# Patient Record
Sex: Female | Born: 1975
Health system: Southern US, Community
[De-identification: ages and names within clinical notes are randomized; demographics above are authoritative.]

## PROBLEM LIST (undated history)

## (undated) DIAGNOSIS — D649 Anemia, unspecified: Secondary | ICD-10-CM

## (undated) DIAGNOSIS — IMO0002 Reserved for concepts with insufficient information to code with codable children: Secondary | ICD-10-CM

## (undated) DIAGNOSIS — E039 Hypothyroidism, unspecified: Secondary | ICD-10-CM

## (undated) DIAGNOSIS — K648 Other hemorrhoids: Secondary | ICD-10-CM

## (undated) HISTORY — DX: Other hemorrhoids: K64.8

## (undated) HISTORY — DX: Hypothyroidism, unspecified: E03.9

## (undated) HISTORY — DX: Anemia, unspecified: D64.9

## (undated) HISTORY — PX: PELVIC LAPAROSCOPY: SHX162

## (undated) HISTORY — DX: Reserved for concepts with insufficient information to code with codable children: IMO0002

---

## 2003-02-08 ENCOUNTER — Encounter: Payer: Self-pay | Admitting: Family Medicine

## 2003-02-08 ENCOUNTER — Encounter: Admission: RE | Admit: 2003-02-08 | Discharge: 2003-02-08 | Payer: Self-pay | Admitting: Family Medicine

## 2003-02-25 ENCOUNTER — Encounter: Admission: RE | Admit: 2003-02-25 | Discharge: 2003-02-25 | Payer: Self-pay | Admitting: Family Medicine

## 2003-02-25 ENCOUNTER — Encounter: Payer: Self-pay | Admitting: Family Medicine

## 2004-01-14 ENCOUNTER — Encounter: Admission: RE | Admit: 2004-01-14 | Discharge: 2004-01-14 | Payer: Self-pay | Admitting: Gastroenterology

## 2004-02-10 ENCOUNTER — Other Ambulatory Visit: Admission: RE | Admit: 2004-02-10 | Discharge: 2004-02-10 | Payer: Self-pay | Admitting: Obstetrics and Gynecology

## 2004-07-28 ENCOUNTER — Ambulatory Visit (HOSPITAL_COMMUNITY): Admission: RE | Admit: 2004-07-28 | Discharge: 2004-07-28 | Payer: Self-pay | Admitting: Obstetrics and Gynecology

## 2004-07-28 ENCOUNTER — Encounter (INDEPENDENT_AMBULATORY_CARE_PROVIDER_SITE_OTHER): Payer: Self-pay | Admitting: *Deleted

## 2005-03-15 ENCOUNTER — Encounter: Admission: RE | Admit: 2005-03-15 | Discharge: 2005-03-15 | Payer: Self-pay | Admitting: Family Medicine

## 2008-05-24 ENCOUNTER — Inpatient Hospital Stay (HOSPITAL_COMMUNITY): Admission: AD | Admit: 2008-05-24 | Discharge: 2008-05-24 | Payer: Self-pay | Admitting: Obstetrics & Gynecology

## 2008-05-26 ENCOUNTER — Inpatient Hospital Stay (HOSPITAL_COMMUNITY): Admission: AD | Admit: 2008-05-26 | Discharge: 2008-05-29 | Payer: Self-pay | Admitting: Obstetrics & Gynecology

## 2008-05-26 ENCOUNTER — Inpatient Hospital Stay (HOSPITAL_COMMUNITY): Admission: AD | Admit: 2008-05-26 | Discharge: 2008-05-26 | Payer: Self-pay | Admitting: Obstetrics and Gynecology

## 2008-06-01 ENCOUNTER — Encounter: Admission: RE | Admit: 2008-06-01 | Discharge: 2008-06-30 | Payer: Self-pay | Admitting: Obstetrics & Gynecology

## 2008-07-01 ENCOUNTER — Encounter: Admission: RE | Admit: 2008-07-01 | Discharge: 2008-07-31 | Payer: Self-pay | Admitting: Obstetrics & Gynecology

## 2008-08-01 ENCOUNTER — Encounter: Admission: RE | Admit: 2008-08-01 | Discharge: 2008-08-30 | Payer: Self-pay | Admitting: Obstetrics & Gynecology

## 2008-08-31 ENCOUNTER — Encounter: Admission: RE | Admit: 2008-08-31 | Discharge: 2008-09-30 | Payer: Self-pay | Admitting: Obstetrics & Gynecology

## 2008-10-01 ENCOUNTER — Encounter: Admission: RE | Admit: 2008-10-01 | Discharge: 2008-10-31 | Payer: Self-pay | Admitting: Obstetrics & Gynecology

## 2009-08-21 ENCOUNTER — Emergency Department: Payer: Self-pay | Admitting: Emergency Medicine

## 2009-08-24 ENCOUNTER — Encounter
Admission: RE | Admit: 2009-08-24 | Discharge: 2009-08-24 | Payer: Self-pay | Admitting: Physical Medicine and Rehabilitation

## 2009-09-07 ENCOUNTER — Encounter: Payer: Self-pay | Admitting: Family Medicine

## 2010-09-01 ENCOUNTER — Encounter: Payer: Self-pay | Admitting: Family Medicine

## 2010-09-21 ENCOUNTER — Encounter: Payer: Self-pay | Admitting: Family Medicine

## 2010-09-21 ENCOUNTER — Ambulatory Visit
Admission: RE | Admit: 2010-09-21 | Discharge: 2010-09-21 | Payer: Self-pay | Source: Home / Self Care | Attending: Family Medicine | Admitting: Family Medicine

## 2010-09-21 ENCOUNTER — Other Ambulatory Visit: Payer: Self-pay | Admitting: Family Medicine

## 2010-09-21 DIAGNOSIS — N801 Endometriosis of ovary: Secondary | ICD-10-CM | POA: Insufficient documentation

## 2010-09-21 DIAGNOSIS — D649 Anemia, unspecified: Secondary | ICD-10-CM | POA: Insufficient documentation

## 2010-09-21 DIAGNOSIS — R109 Unspecified abdominal pain: Secondary | ICD-10-CM | POA: Insufficient documentation

## 2010-09-21 LAB — BASIC METABOLIC PANEL
BUN: 10 mg/dL (ref 6–23)
CO2: 26 mEq/L (ref 19–32)
Calcium: 9.2 mg/dL (ref 8.4–10.5)
Chloride: 101 mEq/L (ref 96–112)
Creatinine, Ser: 0.7 mg/dL (ref 0.4–1.2)
GFR: 108.86 mL/min (ref >60.00)
Glucose, Bld: 86 mg/dL (ref 70–99)
Potassium: 4.7 mEq/L (ref 3.5–5.1)
Sodium: 135 mEq/L (ref 135–145)

## 2010-09-21 LAB — CBC WITH DIFFERENTIAL/PLATELET
Basophils Absolute: 0 10*3/uL (ref 0.0–0.1)
Basophils Relative: 0.4 % (ref 0.0–3.0)
Eosinophils Absolute: 0.1 10*3/uL (ref 0.0–0.7)
Eosinophils Relative: 1.3 % (ref 0.0–5.0)
HCT: 38.1 % (ref 36.0–46.0)
Hemoglobin: 12.8 g/dL (ref 12.0–15.0)
Lymphocytes Relative: 32.7 % (ref 12.0–46.0)
Lymphs Abs: 2.1 10*3/uL (ref 0.7–4.0)
MCHC: 33.5 g/dL (ref 30.0–36.0)
MCV: 84.2 fl (ref 78.0–100.0)
Monocytes Absolute: 0.5 10*3/uL (ref 0.1–1.0)
Monocytes Relative: 7 % (ref 3.0–12.0)
Neutro Abs: 3.9 10*3/uL (ref 1.4–7.7)
Neutrophils Relative %: 58.6 % (ref 43.0–77.0)
Platelets: 267 10*3/uL (ref 150.0–400.0)
RBC: 4.53 Mil/uL (ref 3.87–5.11)
RDW: 13.4 % (ref 11.5–14.6)
WBC: 6.6 10*3/uL (ref 4.5–10.5)

## 2010-09-21 LAB — LIPID PANEL
Cholesterol: 137 mg/dL (ref 0–200)
HDL: 41.4 mg/dL (ref >39.00)
LDL Cholesterol: 89 mg/dL (ref 0–99)
Total CHOL/HDL Ratio: 3
Triglycerides: 31 mg/dL (ref 0.0–149.0)
VLDL: 6.2 mg/dL (ref 0.0–40.0)

## 2010-09-21 LAB — HEPATIC FUNCTION PANEL
ALT: 23 U/L (ref 0–35)
AST: 21 U/L (ref 0–37)
Albumin: 3.9 g/dL (ref 3.5–5.2)
Alkaline Phosphatase: 57 U/L (ref 39–117)
Bilirubin, Direct: 0.2 mg/dL (ref 0.0–0.3)
Total Bilirubin: 0.9 mg/dL (ref 0.3–1.2)
Total Protein: 7.2 g/dL (ref 6.0–8.3)

## 2010-09-21 LAB — TSH: TSH: 3.71 u[IU]/mL (ref 0.35–5.50)

## 2010-10-17 ENCOUNTER — Encounter: Payer: Self-pay | Admitting: Family Medicine

## 2010-10-19 NOTE — Assessment & Plan Note (Signed)
Summary: NEW PATIENT, EST / LFW   Vital Signs:  Patient profile:   35 year old female Height:      65.50 inches Weight:      155 pounds BMI:     25.49 Temp:     98.3 degrees F oral Pulse rate:   75 / minute Pulse rhythm:   regular BP sitting:   102 / 70  (left arm) Cuff size:   regular  Vitals Entered By: Linde Gillis CMA Duncan Dull) (September 21, 2010 11:05 AM) CC: new patient, establish care   History of Present Illness: 35 yo here to establish care.  Abdominal pain- very intermittent, usually postpradnial, usually lower quadrant. Sometimes right sided, other times left. No associated nausea, vomiting or diarrhea. Occasional constipation. Feels like her hair is falling out as well.  Was told after her pregnancy that she was anemic. No SOB or CP. No dizziness.  herniated disc- followed by [facility].  Preventive Screening-Counseling & Management  Alcohol-Tobacco     Smoking Status: never      Drug Use:  no.    Current Medications (verified): 1)  Centrum Ultra Womens  Tabs (Multiple Vitamins-Minerals) .... 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 Eyes:  Denies blurring. ENT:  Denies difficulty  swallowing. CV:  Denies chest pain or discomfort. Resp:  Denies shortness of breath. GI:  Denies abdominal pain, bloody stools, and change in bowel habits. GU:  Denies discharge and dysuria. MS:  Complains of low back pain; denies joint pain, joint redness, joint swelling, and muscle weakness. Derm:  Denies rash. Neuro:  Denies headaches. Psych:  Denies anxiety and depression. Endo:  Denies cold intolerance and heat intolerance. Heme:  Denies abnormal bruising and bleeding.  Physical Exam  General:  Well-developed,well-nourished,in no acute distress; alert,appropriate and cooperative throughout examination Head:  normocephalic, atraumatic, and no abnormalities observed.   Eyes:  vision grossly intact, pupils equal, pupils round, and pupils reactive to light.   Ears:  R ear normal and L ear normal.   Nose:  no external deformity.   Mouth:  good dentition.   Neck:  No deformities, masses, or tenderness noted. Lungs:  Normal respiratory effort, chest expands symmetrically. Lungs are clear to auscultation, no crackles or wheezes. Heart:  Normal rate and regular rhythm. S1 and S2 normal without gallop, murmur, click, rub or other extra sounds. Abdomen:  Bowel sounds positive,abdomen soft and non-tender without masses, organomegaly or hernias noted. Msk:  No deformity or scoliosis noted of thoracic or lumbar spine.   Extremities:  no edema Neurologic:  alert & oriented X3 and gait normal.   Skin:  Intact without suspicious lesions or rashes Psych:  Cognition and judgment appear intact. Alert and cooperative with normal attention span and concentration. No apparent delusions, illusions, hallucinations   Impression &  Recommendations:  Problem # 1:  ABDOMINAL PAIN OTHER SPECIFIED SITE (ICD-789.09) Assessment New Seems most consistent with gas pain. Abdominal exam is benign.  Will check hepatic panel. Advised pt to keep symptom journal, if persists, will get abdomina  U/S. Orders: TLB-Hepatic/Liver Function Pnl (80076-HEPATIC)  Problem # 2:  Hx of ANEMIA (ICD-285.9) Assessment: Unchanged recheck CBC today. Orders: TLB-CBC Platelet - w/Differential (85025-CBCD)  Problem # 3:  R/O UNSPECIFIED HYPOTHYROIDISM (ICD-244.9) Assessment: New check TSH given hair loss and family history. Orders: TLB-TSH (Thyroid Stimulating Hormone) (84443-TSH)  Complete Medication List: 1)  Centrum Ultra Womens Tabs (Multiple vitamins-minerals) .... Take one tablet by mouth daily  Other Orders: Venipuncture (24401) TLB-BMP (Basic Metabolic Panel-BMET) (80048-METABOL) TLB-Lipid Panel (80061-LIPID)  Patient Instructions: 1)  Great to meet you. 2)  If your abdominal pain continues, lets get an ultrasound of your abdomen.     Orders Added: 1)  Venipuncture [36415] 2)  TLB-Hepatic/Liver Function Pnl [80076-HEPATIC] 3)  TLB-TSH (Thyroid Stimulating Hormone) [84443-TSH] 4)  TLB-BMP (Basic Metabolic Panel-BMET) [80048-METABOL] 5)  TLB-Lipid Panel [80061-LIPID] 6)  TLB-CBC Platelet - w/Differential [85025-CBCD] 7)  New Patient Level III [02725]    Current Allergies (reviewed today): No known allergies   Prevention & Chronic Care Immunizations   Influenza vaccine: Not documented    Tetanus booster: 10/08/2001: historical   Tetanus booster due: 10/09/2011    Pneumococcal vaccine: Not documented  Other Screening   Pap smear: normal, historical  (08/31/2010)   Pap smear due: 09/01/2011   Smoking status: never  (09/21/2010)   TD Result Date:  10/08/2001 TD Result:  historical PAP Result Date:  08/31/2010 PAP Result:  normal, historical

## 2010-10-19 NOTE — Letter (Signed)
[facility] - 2010  [facility] at [facility] 2009 - 2010   Imported By: Maryln Gottron 10/10/2010 09:55:34  _____________________________________________________________________  External Attachment:    Type:   Image     Comment:   External Document

## 2010-10-25 NOTE — Miscellaneous (Signed)
Summary: ROI  ROI   Imported By: Kassie Mends 10/20/2010 08:42:13  _____________________________________________________________________  External Attachment:    Type:   Image     Comment:   External Document

## 2010-10-25 NOTE — Miscellaneous (Signed)
Summary: prevention update  Clinical Lists Changes  Observations: Added new observation of PAP DUE: 09/02/2011 (10/17/2010 11:27) Added new observation of HDLNXTDUE: 09/22/2015 (10/17/2010 11:27) Added new observation of LDLNXTDUE: 09/22/2015 (10/17/2010 11:27) Added new observation of CREATNXTDUE: 09/22/2011 (10/17/2010 11:27) Added new observation of POTASSIUMDUE: 09/22/2011 (10/17/2010 11:27) Added new observation of LAST PAP DAT: 09/01/2010 (09/01/2010 11:28) Added new observation of PAP SMEAR: normal (09/01/2010 11:28)     Last PAP:  normal, historical (08/31/2010 11:25:20 AM) PAP Result Date:  09/01/2010 PAP Result:  normal

## 2010-11-02 NOTE — Letter (Signed)
Summary: Historic Patient File  Historic Patient File   Imported By: Kassie Mends 10/25/2010 09:52:31  _____________________________________________________________________  External Attachment:    Type:   Image     Comment:   External Document

## 2011-01-30 NOTE — Consult Note (Signed)
[name], [name]                 ACCOUNT NO.:  [id number]   MEDICAL RECORD NO.:  [id number]          PATIENT TYPE:  INP   LOCATION:  9170                          FACILITY:  [facility]   PHYSICIAN:  Lenoard Aden, M.D.DATE OF BIRTH:  [birth year]   DATE OF CONSULTATION:  05/26/2008  DATE OF DISCHARGE:                                 CONSULTATION   CHIEF COMPLAINTS:  Contractions.   She is a 35 year old female G1, P0 who presents at 39 pulse weeks with  increased frequency of contractions today.   ALLERGIES:  She has no known drug allergies.   MEDICATIONS:  Prenatal vitamins.   SOCIAL HISTORY:  She is a nonsmoker, nondrinker.  She denies domestic or  physical violence.   FAMILY HISTORY:  Diabetes, hypertension, myocardial infarction, thyroid  disease and depression.   Prenatal course to date has been uncomplicated.   PHYSICAL EXAMINATION:  GENERAL:  She is in a well-developed, well-  nourished [place] female, in no acute distress.  HEENT:  Normal.  NECK:  Supple.  Full range of motion.  LUNGS:  Clear.  HEART:  Regular rhythm.  ABDOMEN:  Soft, gravid, nontender.  Estimated fetal weight 7-1/2 pounds.  Cervix is closed, 50% vertex, -1 to 0 station.  EXTREMITIES:  There are  no cords.  NEUR LOGIC:  Nonfocal.  SKIN:  Intact.   NST is reactive with fetal heart rate ranging 140 is with good beat-to-  beat variability and accelerations 15 x 50, no decelerations.  Contractions irregularly every 3-7 minutes apart.   IMPRESSION:  Prodromal labor pattern, no evidence of cervical change  upon sequential cervical exams over the last 3 hours.   PLAN:  Plan will be to discharge home, offered Ambien, the patient  declined.  She is going to follow up in the office for her routine  appointment today which includes an ultrasound.      Lenoard Aden, M.D.  Electronically Signed     RJT/MEDQ  D:  05/26/2008  T:  05/27/2008  Job:  [id number]

## 2011-02-02 NOTE — Op Note (Signed)
[name]                 ACCOUNT NO.:  [id number]   MEDICAL RECORD NO.:  [id number]          PATIENT TYPE:  AMB   LOCATION:  SDC                           [facility]ACILITY:  [facility]   PHYSICIAN:  [name], M.D.   DATE OF BIRTH:  05/16/1976   DATE OF PROCEDURE:  07/28/2004  DATE OF DISCHARGE:                                 OPERATIVE REPORT   PREOPERATIVE DIAGNOSES:  Adnexal mass and pelvic pain.   POSTOPERATIVE DIAGNOSES:  Adnexal mass and pelvic pain.  Endometrioma.   SURGEON:  [name], M.D.   ASSISTANT:  [name], M.D.   ANESTHESIA:  General endotracheal.   COMPLICATIONS:  None.   ESTIMATED BLOOD LOSS:  Minimal.   URINE OUTPUT:  100 mL clear urine at the start of the procedure.   FINDINGS:  A 9 cm left adnexal mass consistent with endometrioma.   SPECIMENS:  Ovarian cyst wall to pathology.   INDICATIONS FOR PROCEDURE:  This is a 35 year old, gravida 0, [place] female  who has a history of progressively worsening painful menses and was found to  have a complex appearing adnexal mass suggestive of an endometrioma on CAT  scan in May of 2005.  Followup ultrasound confirmed the presence of a 4 cm  homogeneous appearing cyst consistent with endometrioma. Followup ultrasound  showed that the cyst had enlarged to a diameter of 7-8 cm, again appeared  homogeneous suggestive of an endometrioma.  The patient was counseled on the  risks, benefits, and alternatives to laparoscopic surgery prior to the  procedure and informed consent was obtained.  Discussed the risk of  hemorrhage requiring transfusion, infection which could cause intraabdominal  scarring or cause adhesions or prolong hospitalization, possibility of  having removed the ovary if cystectomy could not be performed successfully,  possibility of converting to open laparotomy if laparoscope approach was not  feasible, possibility of injury to the bowel, the bladder, the ureters, or  other abdominal  organs which could require additional surgery and prolong  hospitalization, DVT, pulmonary embolus, anesthetic related complications  and even death.  The patient voiced an understanding of all these risks  prior to procedure.  Informed consent was obtained before taking her to the  OR.   DESCRIPTION OF PROCEDURE:  The patient was taken to the operating room where  she was given general anesthesia, she was then prepped and draped in the  usual sterile fashion, placed in dorsal lithotomy position. A red rubber  catheter was then used to drain the bladder. Bimanual exam confirmed the  presence of a mobile mass on the left adnexa, uterus was anteverted and  small. The patient is virginal and a very tight hymenal ring was noted. The  cervix was visualized, cervix was grasped with a single tooth tenaculum and  the acorn cannula was then introduced, speculum was then removed and  attention was then turned to the patient's abdomen after sterile gown and  gloves were changed.  4 mL of 0.5% plain Marcaine were injected in the  infraumbilical area and a 10 mm skin incision was made in  the infraumbilical  area with the scalpel and this was carried down to the fascia. The fascia  was then identified, grasped between two Kocher clamps, elevated and entered  sharply. The peritoneum was identified, grasped with a hemostat and entered  sharply. A finger was placed into the incision, there was no evidence of any  adhesions. The fascia was then secured with a pursestring Vicryl suture and  the Hasson trocar and sleeve were introduced.  CO2 gas was allowed to  insufflate and intraabdominal placement was confirmed with the laparoscope.  On inspection, the cyst had a smooth appearance. There were no external  excrescences noted. The gallbladder and liver were visualized and were  normal. The appendix appeared normal. The uterus and the right ovary  appeared normal. The cul-de-sac was visualized and there were  some  superficial endometriotic appearing implants present in the posterior cul-de-  sac.  A second incision was then made in the patient's right lower quadrant  first by injecting with 2 mL of 0.5% Marcaine making a 5 mm incision and the  5 mm trocar and sleeve were introduced under direct visualization. This  procedure was duplicated on the left side. Once the ports were place, the  blunt probe was then introduced to further inspect the pelvis and the cyst.  The cyst was mobile and upon lifting the cyst with the blunt probe it  promptly ruptured and a large amount of chocolate endometriotic appearing  fluid spilled into the pelvis. The Nezhat was then used to irrigate this  fluid. The cyst was carefully inspected, there was no evidence of any  internal papillation or any suggestion of malignancy.  Two atraumatic  graspers were then used to perform a cystectomy. The cyst wall was  successfully removed from the ovary with minimal bleeding.  Once the cyst  wall was free from the ovary, the 5 mm scope was then introduced into one of  the 5 mm ports and an endobag was introduced through the umbilical incision.  The cyst was then placed into the endobag under laparoscopic visualization  and the endobag and cyst were removed through the umbilical port. This was  then sent to pathology labeled as cyst wall.  The Hasson trocar was then  replaced back into the umbilical incision, the laparoscope was converted  back to the 10 mm scope and the area was carefully inspected. The Nezhat was  used for irrigation. There was some bleeding coming from the ovary, this was  cauterized successfully with the Kleppinger's.  Once cautery was produced,  some of the CO2 gas was allowed to escape to visualize the ovary as the  pressure was released and there was no active bleeding noted. The area was  then copiously and irrigation fluid removed. The superficial areas of endometriosis in the posterior cul-de-sac were  then cauterized with the  Kleppinger's.  The ureters were visualized and were functioning normally.  The right ovary and tube were normal. The left fallopian tube appeared  normal, the uterus was normal and there was no evidence of any further  endometriotic implants.  At this point, 10 mL of 0.5% plain Marcaine were  introduced through one of the ports and the ovary was coated in Marcaine to  facilitate postoperative analgesia.  The ports and instruments were then  removed under direct visualization and CO2 gas was allowed to escape. There  was no evidence of any bleeding and the port sites were hemostatic. At this  point the remaining instruments  and the 10 mm port were removed. All gas was  allowed to escape. The 10 mm skin incision was closed with a pursestring  suture.  Once the fascia was secured, the 10 mm skin incision was closed  with a 4-0 Vicryl subcuticular stitch and the 5 mm ports were closed with  Dermabond. At this point, attention was then turned to the patient's vagina,  the acorn cannula and tenaculum removed, there was normal bleeding coming  from the cervix and the patient was then taken out of the dorsal lithotomy  position and was awakened from general anesthesia. There were no  complications.  All sponge, lap, needle and instrument counts were correct  x2.      JW/MEDQ  D:  07/28/2004  T:  07/28/2004  Job:  [id number]

## 2011-02-02 NOTE — H&P (Signed)
[name], [name]                 ACCOUNT NO.:  [id number]   MEDICAL RECORD NO.:  [id number]          PATIENT TYPE:  AMB   LOCATION:  SDC                           [facility]   PHYSICIAN:  Richardean Sale, M.D.   DATE OF BIRTH:  [birth year]   DATE OF ADMISSION:  07/28/2004  DATE OF DISCHARGE:                                HISTORY & PHYSICAL   CHIEF COMPLAINT:  Adnexal mass.   HISTORY OF PRESENT ILLNESS:  This is a 35 year old, gravida 0, [place] female  who presents for surgical management of an adnexal mass. The mass was  initially identified on a CAT scan which was performed secondary to  complaints of abdominal pain, chronic diarrhea and weight loss. The patient  underwent evaluation by gastroenterology and was found to have a  gastrointestinal parasite in addition to the adnexal mass. On ultrasound in  May, the adnexal mass appeared to be coming from the left ovary and measured  approximately 4 cm in maximum diameter and was homogeneous suggestive of an  endometrioma. Followup scan six weeks later confirmed persistence of the  mass with maximum diameter at that time of 4.5 cm. Given the patient's  weight loss, she underwent evaluation with tumor markers which were all  negative. The patient's menstrual history is significant for very painful  menses specifically on the left side. The patient will miss work secondary  to pain and stays in bed for the majority of her menstrual cycle.  Her  history is highly suggestive of endometriosis. The patient was offered  laparoscopic evaluation and management of the adnexal mass in July of this  year but has postponed her surgery secondary to needing to travel to visit  her family up in [place]. While she was in [place], she had acute onset of  worsening abdominal pain, underwent ultrasound evaluation there that  suggested the mass had enlarged to 6-7 cm in diameter, again appeared  consistent with an endometrioma or possibly a hemorrhagic  cyst per the  ultrasound report from [place]. Followup scan here revealed maximum diameter  of 8.3 cm.  The patient states that over the past few months, she has had  worsening pain with her menses. She denies any chest pain, shortness of  breath. Since her parasite has been treated, she has had no further weight  loss, no constitutional symptoms, no abdominal bloating and no change in  bowel or bladder habits.   PAST MEDICAL HISTORY:  Gastrointestinal parasite.   PAST SURGICAL HISTORY:  None.   MEDICATIONS:  Tylenol as needed.   ALLERGIES:  No known drug allergies.   SOCIAL HISTORY:  No tobacco, alcohol or drugs, she is married.   FAMILY HISTORY:  No known breast, ovarian, uterine or colon cancer. No known  history of blood clot,  DVT or bleeding __________.   REVIEW OF SYMPTOMS:  Denies chest pain, shortness of breath, diarrhea,  constipation, change in bladder habits, dyspnea on exertion, change in  abdominal girth, positive for left lower quadrant pain with menses and with  intercourse.   PHYSICAL EXAMINATION:  VITAL SIGNS:  Blood pressure is 96/58, pulse is in  the 80's and regular, weight 119.5.  Height 5 foot 5.  GENERAL:  She is a thin but well developed female who appears in no acute  distress.  HEART:  Regular rate and rhythm.  LUNGS:  Clear to auscultation bilaterally.  NECK/THYROID:  Within normal limits with no masses.  ABDOMEN:  Soft, nontender, nondistended. No palpable masses. Liver and  spleen are normal. No groin adenopathy.  EXTREMITIES:  No cyanosis, clubbing or edema.  PELVIC:  Extremely difficult secondary to the patient's voluntary guarding.  There is fullness in the left adnexa.   ASSESSMENT:  A 35 year old, gravida 0, [place] female with complex left  adnexal mass. Clinical history suggestive of endometriosis.  Appearance of  mass on ultrasound suggestive of endometrioma or hemorrhagic cyst.  Tumor  markers negative.   PLAN:  Will proceed with  laparoscopic evaluation and attempt at laparoscopic  cystectomy and possible oophorectomy. Explained to the patient that given  the size of the cyst, if there is endometriosis identified it may be  difficult to preserve the ovary and procedure may have to be converted to  open laparotomy.  Reviewed with the patient the possibility of malignancy  given that this mass is complex, however, her tumor markers are negative and  she has no other symptoms suggestive of cancer and no family history.  We  discussed the risks, benefits, and alternatives of the procedure in detail,  reviewed the risk of hemorrhage requiring transfusion, injury to bowel,  bladder, ureter or other abdominal organs which could require additional  surgery or prolonged hospitalization.  Infection which could prolong  hospitalization causing intraabdominal scarring, DVT, PE, and anesthetic  complications.  The patient voiced an understanding of all these risks and  agreed to proceed. Informed consent was obtained, all questions answered.      JW/MEDQ  D:  07/27/2004  T:  07/27/2004  Job:  [id number]

## 2011-04-10 ENCOUNTER — Other Ambulatory Visit: Payer: Self-pay | Admitting: Family Medicine

## 2011-04-10 ENCOUNTER — Ambulatory Visit (INDEPENDENT_AMBULATORY_CARE_PROVIDER_SITE_OTHER): Payer: Self-pay | Admitting: Family Medicine

## 2011-04-10 VITALS — BP 100/70 | HR 70 | Temp 97.1°F

## 2011-04-10 DIAGNOSIS — Z23 Encounter for immunization: Secondary | ICD-10-CM

## 2011-04-10 DIAGNOSIS — X58XXXA Exposure to other specified factors, initial encounter: Secondary | ICD-10-CM

## 2011-04-10 DIAGNOSIS — IMO0002 Reserved for concepts with insufficient information to code with codable children: Secondary | ICD-10-CM

## 2011-04-10 DIAGNOSIS — T148XXA Other injury of unspecified body region, initial encounter: Secondary | ICD-10-CM

## 2011-04-10 MED ORDER — DOXYCYCLINE HYCLATE 100 MG PO CAPS: 100.0000 mg | ORAL_CAPSULE | Freq: Two times a day (BID) | ORAL | Status: AC

## 2011-04-10 NOTE — Progress Notes (Signed)
  Subjective:    Patient ID: [name], female    DOB: [birth year], 36 y.o.   MRN: [id number]  HPI    Review of Systems     Objective:   Physical Exam        Assessment & Plan:   Subjective:    [name] is a 35 y.o. female who presents for evaluation of a laceration to the finger. Injury occurred a few minutes ago. The mechanism of the wound was metal edge. The patient reports no coldness, no paresthesias in the affected area. There were no other injuries.  Patient denies numbness and weakness. The tetanus status is more than 5 years ago.  Patient Active Problem List  Diagnoses  . ANEMIA  . ENDOMETRIOSIS, OVARY  . ABDOMINAL PAIN OTHER SPECIFIED SITE   No past medical history on file. No past surgical history on file. History  Substance Use Topics  . Smoking status: Not on file  . Smokeless tobacco: Not on file  . Alcohol Use: Not on file   No family history on file. No Known Allergies Current Outpatient Prescriptions on File Prior to Visit  Medication Sig Dispense Refill  . doxycycline (VIBRAMYCIN) 100 MG capsule Take 1 capsule (100 mg total) by mouth 2 (two) times daily.  20 capsule  0   The PMH, PSH, Social History, Family History, Medications, and allergies have been reviewed in [facility], and have been updated if relevant.   Review of Systems Pertinent items are noted in HPI.    Objective:    BP 100/70  Pulse 70  Temp(Src) 97.1 F (36.2 C) (Oral)  There is a linear laceration measuring approximately 1 cm in length on the middle of right 1st finger. Examination of the wound for foreign bodies and devitalized tissue showed none.  Examination of the surrounding area for neural or vascular damage showed none.     Assessment:    1 cm flank laceration    Plan:    Wound care discussed. Tetanus immunization given. Systemic antibiotics for 10 days.

## 2011-04-25 ENCOUNTER — Encounter: Payer: Self-pay | Admitting: Physical Medicine and Rehabilitation

## 2011-05-19 ENCOUNTER — Encounter: Payer: Self-pay | Admitting: Physical Medicine and Rehabilitation

## 2011-06-20 LAB — CBC
Hemoglobin: 10.9 — ABNORMAL LOW
Hemoglobin: 13.3
MCHC: 32.9
Platelets: 222
Platelets: 297
RBC: 3.73 — ABNORMAL LOW
RBC: 4.59
RDW: 13.7

## 2011-06-20 LAB — RPR: RPR Ser Ql: NONREACTIVE

## 2011-08-10 ENCOUNTER — Encounter: Payer: Self-pay | Admitting: Family Medicine

## 2011-08-13 ENCOUNTER — Ambulatory Visit: Payer: Self-pay | Admitting: Family Medicine

## 2011-08-20 ENCOUNTER — Encounter: Payer: Self-pay | Admitting: Family Medicine

## 2011-08-20 ENCOUNTER — Ambulatory Visit (INDEPENDENT_AMBULATORY_CARE_PROVIDER_SITE_OTHER): Payer: 59 | Admitting: Family Medicine

## 2011-08-20 VITALS — BP 120/80 | HR 76 | Temp 98.0°F | Wt 143.2 lb

## 2011-08-20 DIAGNOSIS — R1011 Right upper quadrant pain: Secondary | ICD-10-CM | POA: Insufficient documentation

## 2011-08-20 NOTE — Patient Instructions (Signed)
Please stop by to see [name] on your way out to schedule your ultrasound.

## 2011-08-20 NOTE — Progress Notes (Signed)
Subjective:     [name] is a 35 y.o. female who presents for evaluation of abdominal pain. Onset was 2 weeks ago. Symptoms have been completely resolved. The pain is described as colicky, and is 8/10 in intensity. Pain is located in the RUQ without radiation.  Aggravating factors: fatty foods.  Alleviating factors: none. Associated symptoms: flatus. The patient denies anorexia, arthralagias, constipation, fever, melena, nausea, sweats and vomiting.  The patient's history has been marked as reviewed and updated as appropriate.  Patient Active Problem List  Diagnoses  . ANEMIA  . ENDOMETRIOSIS, OVARY  . ABDOMINAL PAIN OTHER SPECIFIED SITE  . RUQ abdominal pain   Past Medical History  Diagnosis Date  . Endometriosis   . Herniated disc    No past surgical history on file. History  Substance Use Topics  . Smoking status: Never Smoker   . Smokeless tobacco: Not on file  . Alcohol Use: No   Family History  Problem Relation Age of Onset  . Depression Other   . Thyroid disease Other    No Known Allergies No current outpatient prescriptions on file prior to visit.    Review of Systems Pertinent items are noted in HPI.     Objective:    BP 120/80  Pulse 76  Temp(Src) 98 F (36.7 C) (Oral)  Wt 143 lb 4 oz (64.978 kg)  General Appearance:    Alert, cooperative, no distress, appears stated age  Head:    Normocephalic, without obvious abnormality, atraumatic  Eyes:    PERRL, conjunctiva/corneas clear, EOM's intact, fundi    benign, both eyes  Ears:    Normal TM's and external ear canals, both ears  Nose:   Nares normal, septum midline, mucosa normal, no drainage    or sinus tenderness  Throat:   Lips, mucosa, and tongue normal; teeth and gums normal  Neck:   Supple, symmetrical, trachea midline, no adenopathy;    thyroid:  no enlargement/tenderness/nodules; no carotid   bruit or JVD  Back:     Symmetric, no curvature, ROM normal, no CVA tenderness  Lungs:     Clear to  auscultation bilaterally, respirations unlabored  Abdomen:     Soft, non-tender, bowel sounds active all four quadrants,    no masses, no organomegaly  Extremities:   Extremities normal, atraumatic, no cyanosis or edema  Pulses:   2+ and symmetric all extremities  Skin:   Skin color, texture, turgor normal, no rashes or lesions  Lymph nodes:   Cervical, supraclavicular, and axillary nodes normal  Neurologic:   CNII-XII intact, normal strength, sensation and reflexes    throughout      Assessment:    Abdominal pain, likely secondary to biliary colic .    Plan:    RUQ ultrasound, refer to gen surg if results pos for biliary stones or ductal dilation

## 2011-08-21 ENCOUNTER — Other Ambulatory Visit: Payer: Self-pay | Admitting: Family Medicine

## 2011-08-21 DIAGNOSIS — R1011 Right upper quadrant pain: Secondary | ICD-10-CM

## 2011-08-21 NOTE — Progress Notes (Signed)
Heather from [facility] called to let us know that they will need a new order put in for the abdominal ultrasound.  The order that was entered was for a limited ultrasound and Herbert Seta stated that the only time a limited ultrasound needs to be ordered is if the patient is returning for a one month follow up ultrasound.  New order entered into epic and faxed to [facility] at [phone].

## 2011-08-23 ENCOUNTER — Other Ambulatory Visit: Payer: Self-pay | Admitting: Family Medicine

## 2011-08-23 DIAGNOSIS — K769 Liver disease, unspecified: Secondary | ICD-10-CM

## 2011-08-24 ENCOUNTER — Encounter: Payer: Self-pay | Admitting: Family Medicine

## 2011-08-29 ENCOUNTER — Telehealth: Payer: Self-pay | Admitting: *Deleted

## 2011-08-29 NOTE — Telephone Encounter (Signed)
Radiology tech from [facility] called to let Dr. Dayton Martes know that patient is there now to have MR of liver which was ordered to be done with and without contrast.  Patient is refusing to have the contract, she is not allergic to it but doesn't want it.  They have advised patient that they really won't know a diagnosis if she has the test done with no contrast but they cannot do it against her wishes.  FYI

## 2011-09-06 ENCOUNTER — Encounter: Payer: Self-pay | Admitting: Family Medicine

## 2011-10-03 ENCOUNTER — Ambulatory Visit (INDEPENDENT_AMBULATORY_CARE_PROVIDER_SITE_OTHER): Payer: 59 | Admitting: Family Medicine

## 2011-10-03 ENCOUNTER — Encounter: Payer: Self-pay | Admitting: Family Medicine

## 2011-10-03 ENCOUNTER — Encounter: Payer: Self-pay | Admitting: *Deleted

## 2011-10-03 VITALS — BP 110/70 | HR 70 | Temp 98.0°F | Ht 65.5 in | Wt 142.0 lb

## 2011-10-03 DIAGNOSIS — D1803 Hemangioma of intra-abdominal structures: Secondary | ICD-10-CM | POA: Insufficient documentation

## 2011-10-03 DIAGNOSIS — Z136 Encounter for screening for cardiovascular disorders: Secondary | ICD-10-CM

## 2011-10-03 DIAGNOSIS — Z0000 Encounter for general adult medical examination without abnormal findings: Secondary | ICD-10-CM

## 2011-10-03 LAB — HEPATIC FUNCTION PANEL
Albumin: 3.9 g/dL (ref 3.5–5.2)
Alkaline Phosphatase: 47 U/L (ref 39–117)
Total Bilirubin: 0.7 mg/dL (ref 0.3–1.2)
Total Protein: 7.3 g/dL (ref 6.0–8.3)

## 2011-10-03 LAB — BASIC METABOLIC PANEL
Chloride: 105 mEq/L (ref 96–112)
Glucose, Bld: 88 mg/dL (ref 70–99)
Potassium: 3.6 mEq/L (ref 3.5–5.1)
Sodium: 139 mEq/L (ref 135–145)

## 2011-10-03 LAB — LIPID PANEL
Triglycerides: 38 mg/dL (ref 0.0–149.0)
VLDL: 7.6 mg/dL (ref 0.0–40.0)

## 2011-10-03 NOTE — Patient Instructions (Signed)
Good to see you. You can stop by the imaging location on your way home and pick up the disc of your MRI.

## 2011-10-03 NOTE — Progress Notes (Signed)
36 yo here to for CPX.  Abdominal pain- very intermittent, usually postpradnial, usually lower quadrant/mid right quadrant.  No associated nausea, vomiting or diarrhea. Occasional constipation. Ultrasound of abdomen on 08/21/2011 showed liver hemangioma that was confirmed on MRI on 09/05/2011.  Feels discomfort has improved.  Sees Dr. Seymour Bars for pap smears, UTD.    Patient Active Problem List  Diagnoses  . ANEMIA  . ENDOMETRIOSIS, OVARY  . ABDOMINAL PAIN OTHER SPECIFIED SITE  . RUQ abdominal pain  . Hepatic lesion  . Routine general medical examination at a health care facility   Past Medical History  Diagnosis Date  . Endometriosis   . Herniated disc    No past surgical history on file. History  Substance Use Topics  . Smoking status: Never Smoker   . Smokeless tobacco: Not on file  . Alcohol Use: No   Family History  Problem Relation Age of Onset  . Depression Other   . Thyroid disease Other    No Known Allergies No current outpatient prescriptions on file prior to visit.   The PMH, PSH, Social History, Family History, Medications, and allergies have been reviewed in [facility], and have been updated if relevant.   Review of Systems       See HPI Patient reports no  vision/ hearing changes,anorexia, weight change, fever ,adenopathy, persistant / recurrent hoarseness, swallowing issues, chest pain, edema,persistant / recurrent cough, hemoptysis, dyspnea(rest, exertional, paroxysmal nocturnal), gastrointestinal  bleeding (melena, rectal bleeding), abdominal pain, excessive heart burn, GU symptoms(dysuria, hematuria, pyuria, voiding/incontinence  Issues) syncope, focal weakness, severe memory loss, concerning skin lesions, depression, anxiety, abnormal bruising/bleeding, major joint swelling, breast masses or abnormal vaginal bleeding.     Physical Exam BP 110/70  Pulse 70  Temp(Src) 98 F (36.7 C) (Oral)  Ht 5' 5.5" (1.664 m)  Wt 142 lb (64.411 kg)  BMI 23.27 kg/m2   LMP 09/25/2011 General:  Well-developed,well-nourished,in no acute distress; alert,appropriate and cooperative throughout examination Head:  normocephalic, atraumatic, and no abnormalities observed.   Eyes:  vision grossly intact, pupils equal, pupils round, and pupils reactive to light.   Ears:  R ear normal and L ear normal.   Nose:  no external deformity.   Mouth:  good dentition.   Neck:  No deformities, masses, or tenderness noted. Lungs:  Normal respiratory effort, chest expands symmetrically. Lungs are clear to auscultation, no crackles or wheezes. Heart:  Normal rate and regular rhythm. S1 and S2 normal without gallop, murmur, click, rub or other extra sounds. Abdomen:  Bowel sounds positive,abdomen soft and non-tender without masses, organomegaly or hernias noted. Msk:  No deformity or scoliosis noted of thoracic or lumbar spine.   Extremities:  no edema Neurologic:  alert & oriented X3 and gait normal.   Skin:  Intact without suspicious lesions or rashes Psych:  Cognition and judgment appear intact. Alert and cooperative with normal attention span and concentration. No apparent delusions, illusions, hallucinations  Assessment and Plan: 1. Routine general medical examination at a health care facility  Reviewed preventive care protocols, scheduled due services, and updated immunizations Discussed nutrition, exercise, diet, and healthy lifestyle.  Basic Metabolic Panel (BMET),  panel, Lipid Profile  2. Liver hemangioma  Discussed with pt, feels symptoms are stable and perhaps even improving.  Will plan follow up MRI in 6 months. The patient indicates understanding of these issues and agrees with the plan.  Hepatic function

## 2011-10-09 ENCOUNTER — Telehealth: Payer: Self-pay | Admitting: Family Medicine

## 2011-10-09 DIAGNOSIS — D1803 Hemangioma of intra-abdominal structures: Secondary | ICD-10-CM

## 2011-10-09 NOTE — Telephone Encounter (Signed)
Order entered

## 2011-10-09 NOTE — Telephone Encounter (Signed)
Called the pt to schedule the 6 mos MRI Abdomen. She wants to have it done at [facility] at [employer]. [facility] asked you to please change the order to a MRI Abdomen with and without contrast. Will call them back when the order is changed as well as the pt. Patient does not need any labs prior according to [facility]. Please cancel order that is in Epic now.

## 2012-03-08 ENCOUNTER — Other Ambulatory Visit: Payer: 59

## 2012-08-06 ENCOUNTER — Encounter: Payer: Self-pay | Admitting: Family Medicine

## 2012-08-06 ENCOUNTER — Ambulatory Visit (INDEPENDENT_AMBULATORY_CARE_PROVIDER_SITE_OTHER): Payer: 59 | Admitting: Family Medicine

## 2012-08-06 VITALS — BP 100/70 | HR 76 | Temp 97.8°F | Wt 143.0 lb

## 2012-08-06 DIAGNOSIS — D1803 Hemangioma of intra-abdominal structures: Secondary | ICD-10-CM

## 2012-08-06 DIAGNOSIS — Z23 Encounter for immunization: Secondary | ICD-10-CM

## 2012-08-06 DIAGNOSIS — R109 Unspecified abdominal pain: Secondary | ICD-10-CM

## 2012-08-06 NOTE — Progress Notes (Signed)
36 yo here to discuss abdominal pain.  Abdominal pain- very intermittent, usually postpradnial, usually lower quadrant/mid right quadrant.  No associated nausea, vomiting or diarrhea. Occasional constipation.  Ultrasound of abdomen on 08/21/2011 showed liver hemangioma that was confirmed on MRI on 09/05/2011.  She was scheduled to have 6 month follow up MRI but she cancelled it.  Symptoms are no worse but they are not better.  She would like to proceed with MRI.  Does occasionally have rectal bleeding- bright red.  Had a colonoscopy several years ago and was told that she had an internal hemorrhoid. Sees Dr. Loreta Ave.     Patient Active Problem List  Diagnosis  . ANEMIA  . ENDOMETRIOSIS, OVARY  . ABDOMINAL PAIN OTHER SPECIFIED SITE  . RUQ abdominal pain  . Hepatic lesion  . Routine general medical examination at a health care facility  . Liver hemangioma   Past Medical History  Diagnosis Date  . Endometriosis   . Herniated disc    No past surgical history on file. History  Substance Use Topics  . Smoking status: Never Smoker   . Smokeless tobacco: Not on file  . Alcohol Use: No   Family History  Problem Relation Age of Onset  . Depression Other   . Thyroid disease Other    No Known Allergies Current Outpatient Prescriptions on File Prior to Visit  Medication Sig Dispense Refill  . acetaminophen (TYLENOL) 500 MG tablet Take 500 mg by mouth every 6 (six) hours as needed.       The PMH, PSH, Social History, Family History, Medications, and allergies have been reviewed in [facility], and have been updated if relevant.   Review of Systems       See HPI Patient reports no  vision/ hearing changes,anorexia, weight change, fever ,adenopathy, persistant / recurrent hoarseness, swallowing issues, chest pain, edema,persistant / recurrent cough, hemoptysis, dyspnea(rest, exertional, paroxysmal nocturnal), gastrointestinal  bleeding (melena, rectal bleeding), abdominal pain, excessive  heart burn, GU symptoms(dysuria, hematuria, pyuria, voiding/incontinence  Issues) syncope, focal weakness, severe memory loss, concerning skin lesions, depression, anxiety, abnormal bruising/bleeding, major joint swelling, breast masses or abnormal vaginal bleeding.     Physical Exam BP 100/70  Pulse 76  Temp 97.8 F (36.6 C)  Wt 143 lb (64.864 kg) General:  Well-developed,well-nourished,in no acute distress; alert,appropriate and cooperative throughout examination Head:  normocephalic, atraumatic, and no abnormalities observed.   Abdomen:  Bowel sounds positive,abdomen soft and non-tender without masses, organomegaly or hernias noted. Skin:  Intact without suspicious lesions or rashes Psych:  Cognition and judgment appear intact. Alert and cooperative with normal attention span and concentration. No apparent delusions, illusions, hallucinations  Assessment and Plan:  1. ABDOMINAL PAIN OTHER SPECIFIED SITE  Unchanged. Will reschedule abdominal MRI and I have suggested she follow up with Dr. Loreta Ave for further evaluation. The patient indicates understanding of these issues and agrees with the plan.    2. Liver hemangioma  See above. MR Abdomen Wo Contrast  3. Need for prophylactic vaccination and inoculation against influenza  Flu vaccine greater than or equal to 3yo preservative free IM

## 2012-08-06 NOTE — Patient Instructions (Addendum)
Please stop by to see [name] to set up your MRI.  Call Dr. Loreta Ave.

## 2012-08-16 ENCOUNTER — Ambulatory Visit
Admission: RE | Admit: 2012-08-16 | Discharge: 2012-08-16 | Disposition: A | Discharge status: Home / Self Care | Payer: 59 | Source: Ambulatory Visit | Attending: Family Medicine | Admitting: Family Medicine

## 2012-08-16 DIAGNOSIS — D1803 Hemangioma of intra-abdominal structures: Secondary | ICD-10-CM

## 2012-08-22 ENCOUNTER — Ambulatory Visit
Admission: RE | Admit: 2012-08-22 | Discharge: 2012-08-22 | Disposition: A | Discharge status: Home / Self Care | Payer: 59 | Source: Ambulatory Visit | Attending: Family Medicine | Admitting: Family Medicine

## 2012-08-22 MED ORDER — GADOXETATE DISODIUM 0.25 MMOL/ML IV SOLN
6.0000 mL | Freq: Once | INTRAVENOUS | Status: AC | PRN
  Administered 2012-08-22: 6 mL via INTRAVENOUS

## 2012-10-13 ENCOUNTER — Ambulatory Visit: Payer: 59 | Admitting: Family Medicine

## 2012-10-13 NOTE — Progress Notes (Signed)
No show

## 2013-02-05 ENCOUNTER — Encounter: Payer: Self-pay | Admitting: Family Medicine

## 2013-02-05 ENCOUNTER — Ambulatory Visit (INDEPENDENT_AMBULATORY_CARE_PROVIDER_SITE_OTHER): Payer: 59 | Admitting: Family Medicine

## 2013-02-05 VITALS — BP 124/74 | HR 63 | Temp 98.1°F | Wt 140.5 lb

## 2013-02-05 DIAGNOSIS — J310 Chronic rhinitis: Secondary | ICD-10-CM

## 2013-02-05 NOTE — Assessment & Plan Note (Signed)
ETD noted.  Rhinitis improved today with benign exam.  Would use claritin or nasal saline as she is improved.  If worsening, consider abx for sinusitis.  I don't suspect ominous dx.  D/w pt.  Call back prn. She agrees.

## 2013-02-05 NOTE — Progress Notes (Signed)
Yesterday she noted a drip out of her L nostril. It didn't feel like a typical runny nose. She had some pain at the L max sinus.  Dripping noted laying down. The fluid had a yellow tint.  She has some pressure on the L max sinus.  No FCNAVD.  She had an episode of blood tinged nasal drainage a few months ago, but it was mild and then self resolved.  Mild ST, noted at night.  No ear pain but ears had felt a little irritated.  No drainage from the R nostril.  Other than as above, she feels well.  She doesn't typically have allergies. She looked it up on the internet and was concerned for a CSF leak.   Meds, vitals, and allergies reviewed.   ROS: See HPI.  Otherwise, noncontributory.  GEN: nad, alert and oriented HEENT: mucous membranes moist, tm w/o erythema, nasal exam w/o erythema, clear discharge noted,  OP with cobblestoning, ETD noted on valsalva, L max sinus mildly ttp NECK: supple w/o LA CV: rrr.   PULM: ctab, no inc wob EXT: no edema SKIN: no acute rash

## 2013-02-05 NOTE — Patient Instructions (Addendum)
Since you are better today, you have a few options.  You could take claritin 10mg  a day or use nasal saline.  This should gradually get better.  If you have more pain or concerns, then let us know.  Take care.

## 2013-03-30 ENCOUNTER — Encounter: Payer: Self-pay | Admitting: Family Medicine

## 2013-03-30 ENCOUNTER — Ambulatory Visit (INDEPENDENT_AMBULATORY_CARE_PROVIDER_SITE_OTHER): Payer: 59 | Admitting: Family Medicine

## 2013-03-30 VITALS — BP 100/80 | HR 68 | Temp 97.9°F | Ht 63.5 in | Wt 142.0 lb

## 2013-03-30 DIAGNOSIS — Z0000 Encounter for general adult medical examination without abnormal findings: Secondary | ICD-10-CM

## 2013-03-30 DIAGNOSIS — D649 Anemia, unspecified: Secondary | ICD-10-CM

## 2013-03-30 DIAGNOSIS — Z136 Encounter for screening for cardiovascular disorders: Secondary | ICD-10-CM

## 2013-03-30 LAB — COMPREHENSIVE METABOLIC PANEL
AST: 19 U/L (ref 0–37)
Alkaline Phosphatase: 40 U/L (ref 39–117)
CO2: 27 mEq/L (ref 19–32)
Calcium: 9.2 mg/dL (ref 8.4–10.5)
Chloride: 106 mEq/L (ref 96–112)
Creatinine, Ser: 0.7 mg/dL (ref 0.4–1.2)
Potassium: 4.3 mEq/L (ref 3.5–5.1)
Total Bilirubin: 0.8 mg/dL (ref 0.3–1.2)

## 2013-03-30 LAB — CBC WITH DIFFERENTIAL/PLATELET
Basophils Relative: 0.6 % (ref 0.0–3.0)
Eosinophils Absolute: 0.2 10*3/uL (ref 0.0–0.7)
HCT: 40 % (ref 36.0–46.0)
Hemoglobin: 13.1 g/dL (ref 12.0–15.0)
MCHC: 32.9 g/dL (ref 30.0–36.0)
MCV: 87.2 fl (ref 78.0–100.0)
Monocytes Absolute: 0.4 10*3/uL (ref 0.1–1.0)
Monocytes Relative: 8.1 % (ref 3.0–12.0)
Platelets: 226 10*3/uL (ref 150.0–400.0)
RBC: 4.59 Mil/uL (ref 3.87–5.11)
RDW: 13.4 % (ref 11.5–14.6)

## 2013-03-30 LAB — LIPID PANEL
LDL Cholesterol: 63 mg/dL (ref 0–99)
Triglycerides: 34 mg/dL (ref 0.0–149.0)

## 2013-03-30 MED ORDER — CYCLOBENZAPRINE HCL 5 MG PO TABS: 5.0000 mg | ORAL_TABLET | Freq: Three times a day (TID) | ORAL | Status: DC | PRN

## 2013-03-30 NOTE — Progress Notes (Signed)
37 yo pleasant female here to for CPX.  Sees Dr. Seymour Bars for pap smears, UTD.  Lumbar DJD- saw [facility] in 2010.  MRI reviewed. She did have significant degenerative changes and narrowing of lower spine.  Has had no radiculopathy in past several months but given her age, she is concerned about what she can do to prevent her back from getting worse. Does work on a computer all day and is active with her 23 year old daughter.  No family h/o breast CA.    Patient Active Problem List   Diagnosis Date Noted  . Rhinitis 02/05/2013  . Routine general medical examination at a health care facility 10/03/2011  . Liver hemangioma 10/03/2011  . Hepatic lesion 08/23/2011  . RUQ abdominal pain 08/20/2011  . ANEMIA 09/21/2010  . ENDOMETRIOSIS, OVARY 09/21/2010   Past Medical History  Diagnosis Date  . Endometriosis   . Herniated disc    No past surgical history on file. History  Substance Use Topics  . Smoking status: Never Smoker   . Smokeless tobacco: Not on file  . Alcohol Use: No   Family History  Problem Relation Age of Onset  . Depression Other   . Thyroid disease Other    No Known Allergies Current Outpatient Prescriptions on File Prior to Visit  Medication Sig Dispense Refill  . acetaminophen (TYLENOL) 500 MG tablet Take 500 mg by mouth every 6 (six) hours as needed.      . Meloxicam (MOBIC PO) Take one by mouth daily- pt unsure of strength       No current facility-administered medications on file prior to visit.   The PMH, PSH, Social History, Family History, Medications, and allergies have been reviewed in [facility], and have been updated if relevant.   Review of Systems       See HPI Patient reports no  vision/ hearing changes,anorexia, weight change, fever ,adenopathy, persistant / recurrent hoarseness, swallowing issues, chest pain, edema,persistant / recurrent cough, hemoptysis, dyspnea(rest, exertional, paroxysmal nocturnal), gastrointestinal  bleeding (melena,  rectal bleeding), abdominal pain, excessive heart burn, GU symptoms(dysuria, hematuria, pyuria, voiding/incontinence  Issues) syncope, focal weakness, severe memory loss, concerning skin lesions, depression, anxiety, abnormal bruising/bleeding, major joint swelling, breast masses or abnormal vaginal bleeding.     Physical Exam BP 100/80  Pulse 68  Temp(Src) 97.9 F (36.6 C)  Ht 5' 3.5" (1.613 m)  Wt 142 lb (64.411 kg)  BMI 24.76 kg/m2 General:  Well-developed,well-nourished,in no acute distress; alert,appropriate and cooperative throughout examination Head:  normocephalic, atraumatic, and no abnormalities observed.   Eyes:  vision grossly intact, pupils equal, pupils round, and pupils reactive to light.   Ears:  R ear normal and L ear normal.   Nose:  no external deformity.   Mouth:  good dentition.   Neck:  No deformities, masses, or tenderness noted. Lungs:  Normal respiratory effort, chest expands symmetrically. Lungs are clear to auscultation, no crackles or wheezes. Heart:  Normal rate and regular rhythm. S1 and S2 normal without gallop, murmur, click, rub or other extra sounds. Abdomen:  Bowel sounds positive,abdomen soft and non-tender without masses, organomegaly or hernias noted. Msk:  No deformity or scoliosis noted of thoracic or lumbar spine.   Extremities:  no edema Neurologic:  alert & oriented X3 and gait normal.   Skin:  Intact without suspicious lesions or rashes Psych:  Cognition and judgment appear intact. Alert and cooperative with normal attention span and concentration. No apparent delusions, illusions, hallucinations  Assessment and Plan:  1. Routine general medical examination at a health care facility Reviewed preventive care protocols, scheduled due services, and updated immunizations Discussed nutrition, exercise, diet, and healthy lifestyle.  - Comprehensive metabolic panel  2. ANEMIA  - CBC with Differential  3. Screening for ischemic heart  disease  4.  DJD of lumbar spine- I advised calling ortho to discuss.  May be time to repeat MRI. The patient indicates understanding of these issues and agrees with the plan.  - Lipid Panel

## 2013-03-30 NOTE — Patient Instructions (Addendum)
Good to see you. Call [facility] orthopedics to set up an appointment.  Use flexeril as needed - it can make you sleepy as we discussed.

## 2013-07-09 ENCOUNTER — Ambulatory Visit: Payer: 59

## 2013-07-14 ENCOUNTER — Encounter (INDEPENDENT_AMBULATORY_CARE_PROVIDER_SITE_OTHER): Payer: Self-pay

## 2013-07-14 ENCOUNTER — Ambulatory Visit (INDEPENDENT_AMBULATORY_CARE_PROVIDER_SITE_OTHER): Payer: 59

## 2013-07-14 DIAGNOSIS — Z23 Encounter for immunization: Secondary | ICD-10-CM

## 2013-08-20 ENCOUNTER — Other Ambulatory Visit: Payer: Self-pay | Admitting: Gastroenterology

## 2013-08-20 DIAGNOSIS — R1011 Right upper quadrant pain: Secondary | ICD-10-CM

## 2013-08-25 ENCOUNTER — Encounter: Payer: Self-pay | Admitting: Family Medicine

## 2013-08-26 ENCOUNTER — Ambulatory Visit (INDEPENDENT_AMBULATORY_CARE_PROVIDER_SITE_OTHER): Payer: 59 | Admitting: Internal Medicine

## 2013-08-26 ENCOUNTER — Encounter: Payer: Self-pay | Admitting: Internal Medicine

## 2013-08-26 VITALS — BP 106/68 | HR 77 | Temp 97.9°F | Wt 137.2 lb

## 2013-08-26 DIAGNOSIS — R946 Abnormal results of thyroid function studies: Secondary | ICD-10-CM

## 2013-08-26 DIAGNOSIS — R1031 Right lower quadrant pain: Secondary | ICD-10-CM

## 2013-08-26 DIAGNOSIS — R7989 Other specified abnormal findings of blood chemistry: Secondary | ICD-10-CM

## 2013-08-26 DIAGNOSIS — G8929 Other chronic pain: Secondary | ICD-10-CM | POA: Insufficient documentation

## 2013-08-26 MED ORDER — DICYCLOMINE HCL 10 MG PO CAPS: 10.0000 mg | ORAL_CAPSULE | Freq: Three times a day (TID) | ORAL | Status: DC

## 2013-08-26 NOTE — Patient Instructions (Signed)

## 2013-08-26 NOTE — Progress Notes (Signed)
Subjective:    Patient ID: [name], female    DOB: Aug 25, 1976, 37 y.o.   MRN: [id number]  HPI  Pt presents to the clinic today to discuss results by Dr. Loreta Ave. She went to see Dr. Loreta Ave (GI) for this chronic RLQ abdominal pain that she had been having. MRI of abdomen in 2012 showed possible hemangioma on liver. The pain seems to be worse after she eats. She is having BM's daily. She denies blood in her stool. Dr. Loreta Ave suggested that she have a HIDA scan to take a better look at the gallbladder. She is not sure if this is something she is interested in. Additionally, Dr. Loreta Ave tested her thyroid which came back elevated. She is wondering if she needs treatment for that. She does have a family history of hypothyroidism. She denies constipation, abnormal cold feeling, fatigue or weight gain.  Review of Systems      Past Medical History  Diagnosis Date  . Endometriosis   . Herniated disc     Current Outpatient Prescriptions  Medication Sig Dispense Refill  . acetaminophen (TYLENOL) 500 MG tablet Take 500 mg by mouth every 6 (six) hours as needed.      . cyclobenzaprine (FLEXERIL) 5 MG tablet Take 1 tablet (5 mg total) by mouth 3 (three) times daily as needed for muscle spasms.  30 tablet  1  . ibuprofen (ADVIL,MOTRIN) 200 MG tablet Take 200 mg by mouth daily.      . Meloxicam (MOBIC PO) Take one by mouth daily- pt unsure of strength      . Probiotic Product (RESTORA PO) Take 1 capsule by mouth daily.      Marland Kitchen dicyclomine (BENTYL) 10 MG capsule Take 1 capsule (10 mg total) by mouth 4 (four) times daily -  before meals and at bedtime.  120 capsule  0   No current facility-administered medications for this visit.    No Known Allergies  Family History  Problem Relation Age of Onset  . Depression Other   . Thyroid disease Other     History   Social History  . Marital Status: Married    Spouse Name: N/A    Number of Children: 1  . Years of Education: N/A   Occupational History    .  [employer]   Social History Main Topics  . Smoking status: Never Smoker   . Smokeless tobacco: Never Used  . Alcohol Use: No  . Drug Use: No  . Sexual Activity: Not on file   Other Topics Concern  . Not on file   Social History Narrative  . No narrative on file     Constitutional: Denies fever, malaise, fatigue, headache or abrupt weight changes.  Respiratory: Denies difficulty breathing, shortness of breath, cough or sputum production.   Cardiovascular: Denies chest pain, chest tightness, palpitations or swelling in the hands or feet.  Gastrointestinal: Denies abdominal pain, bloating, constipation, diarrhea or blood in the stool.  GU: Denies urgency, frequency, pain with urination, burning sensation, blood in urine, odor or discharge.   No other specific complaints in a complete review of systems (except as listed in HPI above).  Objective:   Physical Exam    BP 106/68  Pulse 77  Temp(Src) 97.9 F (36.6 C) (Oral)  Wt 137 lb 4 oz (62.256 kg)  SpO2 99% Wt Readings from Last 3 Encounters:  08/26/13 137 lb 4 oz (62.256 kg)  03/30/13 142 lb (64.411 kg)  02/05/13 140 lb 8 oz (  63.73 kg)    General: Appears her stated age, well developed, well nourished in NAD.  Neck: Normal range of motion. Neck supple, trachea midline. No massses, lumps or thyromegaly present.  Cardiovascular: Normal rate and rhythm. S1,S2 noted.  No murmur, rubs or gallops noted. No JVD or BLE edema. No carotid bruits noted. Pulmonary/Chest: Normal effort and positive vesicular breath sounds. No respiratory distress. No wheezes, rales or ronchi noted.  Abdomen: Soft and tender in the RLQ. Normal bowel sounds, no bruits noted. No distention or masses noted. Liver, spleen and kidneys non palpable.   BMET    Component Value Date/Time   NA 136 03/30/2013 0856   K 4.3 03/30/2013 0856   CL 106 03/30/2013 0856   CO2 27 03/30/2013 0856   GLUCOSE 89 03/30/2013 0856   BUN 11 03/30/2013 0856    CREATININE 0.7 03/30/2013 0856   CALCIUM 9.2 03/30/2013 0856    Lipid Panel     Component Value Date/Time   CHOL 120 03/30/2013 0856   TRIG 34.0 03/30/2013 0856   HDL 50.00 03/30/2013 0856   CHOLHDL 2 03/30/2013 0856   VLDL 6.8 03/30/2013 0856   LDLCALC 63 03/30/2013 0856    CBC    Component Value Date/Time   WBC 5.3 03/30/2013 0856   RBC 4.59 03/30/2013 0856   HGB 13.1 03/30/2013 0856   HCT 40.0 03/30/2013 0856   PLT 226.0 03/30/2013 0856   MCV 87.2 03/30/2013 0856   MCHC 32.9 03/30/2013 0856   RDW 13.4 03/30/2013 0856   LYMPHSABS 2.0 03/30/2013 0856   MONOABS 0.4 03/30/2013 0856   EOSABS 0.2 03/30/2013 0856   BASOSABS 0.0 03/30/2013 0856    Hgb A1C No results found for this basename: HGBA1C       Assessment & Plan:   Elevated TSH without thyromegaly or nodule:  Plan to recheck TSH in 4 weeks  RTC in 4 weeks for lab visit

## 2013-08-26 NOTE — Assessment & Plan Note (Signed)
Idiopathic at this point She has agreed to do the HIDA scan Normal colonoscopy in 2005, will repeat in 2015 MRI abdomen in 2012- liver hemangioma On probiotic without any relief Will try Bentyl to see if there is any improvement

## 2013-08-26 NOTE — Progress Notes (Signed)
Pre-visit discussion using our clinic review tool. No additional management support is needed unless otherwise documented below in the visit note.  

## 2013-09-04 ENCOUNTER — Other Ambulatory Visit (HOSPITAL_COMMUNITY): Payer: 59

## 2013-09-08 ENCOUNTER — Encounter (HOSPITAL_COMMUNITY): Payer: 59

## 2013-09-16 ENCOUNTER — Other Ambulatory Visit (INDEPENDENT_AMBULATORY_CARE_PROVIDER_SITE_OTHER): Payer: 59

## 2013-09-16 DIAGNOSIS — R7989 Other specified abnormal findings of blood chemistry: Secondary | ICD-10-CM

## 2013-09-16 DIAGNOSIS — R946 Abnormal results of thyroid function studies: Secondary | ICD-10-CM

## 2014-03-16 ENCOUNTER — Other Ambulatory Visit: Payer: Self-pay | Admitting: Gastroenterology

## 2014-03-16 DIAGNOSIS — R1011 Right upper quadrant pain: Secondary | ICD-10-CM

## 2014-03-17 ENCOUNTER — Other Ambulatory Visit: Payer: Self-pay | Admitting: Gastroenterology

## 2014-03-17 DIAGNOSIS — R1011 Right upper quadrant pain: Secondary | ICD-10-CM

## 2014-03-31 ENCOUNTER — Ambulatory Visit (HOSPITAL_COMMUNITY)
Admission: RE | Admit: 2014-03-31 | Discharge: 2014-03-31 | Disposition: A | Discharge status: Home / Self Care | Payer: 59 | Source: Ambulatory Visit | Attending: Gastroenterology | Admitting: Gastroenterology

## 2014-03-31 ENCOUNTER — Encounter (HOSPITAL_COMMUNITY)
Admission: RE | Admit: 2014-03-31 | Discharge: 2014-03-31 | Disposition: A | Discharge status: Home / Self Care | Payer: 59 | Source: Ambulatory Visit | Attending: Gastroenterology | Admitting: Gastroenterology

## 2014-03-31 DIAGNOSIS — R1011 Right upper quadrant pain: Secondary | ICD-10-CM | POA: Insufficient documentation

## 2014-03-31 MED ORDER — TECHNETIUM TC 99M MEBROFENIN IV KIT
5.0000 | PACK | Freq: Once | INTRAVENOUS | Status: AC | PRN
  Administered 2014-03-31: 5 via INTRAVENOUS

## 2014-03-31 MED ORDER — STERILE WATER FOR INJECTION IJ SOLN
INTRAMUSCULAR | Status: AC
  Filled 2014-03-31: qty 10

## 2014-03-31 MED ORDER — SINCALIDE 5 MCG IJ SOLR
INTRAMUSCULAR | Status: AC
  Administered 2014-03-31: 1.2 ug via INTRAVENOUS
  Filled 2014-03-31: qty 5

## 2014-03-31 MED ORDER — SINCALIDE 5 MCG IJ SOLR
0.0200 ug/kg | Freq: Once | INTRAMUSCULAR | Status: AC
  Administered 2014-03-31: 1.2 ug via INTRAVENOUS

## 2014-07-15 ENCOUNTER — Ambulatory Visit (INDEPENDENT_AMBULATORY_CARE_PROVIDER_SITE_OTHER): Payer: 59

## 2014-07-15 DIAGNOSIS — Z23 Encounter for immunization: Secondary | ICD-10-CM

## 2014-08-31 ENCOUNTER — Other Ambulatory Visit (INDEPENDENT_AMBULATORY_CARE_PROVIDER_SITE_OTHER): Payer: 59

## 2014-08-31 ENCOUNTER — Other Ambulatory Visit: Payer: Self-pay | Admitting: Family Medicine

## 2014-08-31 DIAGNOSIS — Z01419 Encounter for gynecological examination (general) (routine) without abnormal findings: Secondary | ICD-10-CM

## 2014-08-31 DIAGNOSIS — R1030 Lower abdominal pain, unspecified: Secondary | ICD-10-CM

## 2014-08-31 DIAGNOSIS — R350 Frequency of micturition: Secondary | ICD-10-CM

## 2014-08-31 DIAGNOSIS — R102 Pelvic and perineal pain: Secondary | ICD-10-CM | POA: Insufficient documentation

## 2014-08-31 DIAGNOSIS — Z0000 Encounter for general adult medical examination without abnormal findings: Secondary | ICD-10-CM

## 2014-08-31 LAB — TSH: TSH: 6.59 u[IU]/mL — ABNORMAL HIGH (ref 0.35–4.50)

## 2014-08-31 LAB — COMPREHENSIVE METABOLIC PANEL
ALT: 17 U/L (ref 0–35)
AST: 18 U/L (ref 0–37)
Albumin: 3.9 g/dL (ref 3.5–5.2)
Alkaline Phosphatase: 44 U/L (ref 39–117)
BILIRUBIN TOTAL: 0.9 mg/dL (ref 0.2–1.2)
BUN: 16 mg/dL (ref 6–23)
CALCIUM: 9.2 mg/dL (ref 8.4–10.5)
CO2: 26 mEq/L (ref 19–32)
CREATININE: 0.7 mg/dL (ref 0.4–1.2)
Chloride: 105 mEq/L (ref 96–112)
GFR: 104.64 mL/min (ref >60.00)
Glucose, Bld: 95 mg/dL (ref 70–99)
Potassium: 4.4 mEq/L (ref 3.5–5.1)
Sodium: 135 mEq/L (ref 135–145)
Total Protein: 7 g/dL (ref 6.0–8.3)

## 2014-08-31 LAB — CBC WITH DIFFERENTIAL/PLATELET
BASOS ABS: 0 10*3/uL (ref 0.0–0.1)
BASOS PCT: 0.5 % (ref 0.0–3.0)
EOS ABS: 0.1 10*3/uL (ref 0.0–0.7)
Eosinophils Relative: 3 % (ref 0.0–5.0)
HEMATOCRIT: 38.7 % (ref 36.0–46.0)
HEMOGLOBIN: 12.2 g/dL (ref 12.0–15.0)
LYMPHS ABS: 2.2 10*3/uL (ref 0.7–4.0)
Lymphocytes Relative: 44.7 % (ref 12.0–46.0)
MCHC: 31.5 g/dL (ref 30.0–36.0)
MCV: 86.4 fl (ref 78.0–100.0)
Monocytes Absolute: 0.4 10*3/uL (ref 0.1–1.0)
Monocytes Relative: 7.5 % (ref 3.0–12.0)
NEUTROS ABS: 2.1 10*3/uL (ref 1.4–7.7)
Neutrophils Relative %: 44.3 % (ref 43.0–77.0)
Platelets: 283 10*3/uL (ref 150.0–400.0)
RBC: 4.48 Mil/uL (ref 3.87–5.11)
RDW: 13.6 % (ref 11.5–15.5)
WBC: 4.8 10*3/uL (ref 4.0–10.5)

## 2014-08-31 LAB — LIPID PANEL
Cholesterol: 124 mg/dL (ref 0–200)
HDL: 45.7 mg/dL (ref >39.00)
LDL Cholesterol: 72 mg/dL (ref 0–99)
NONHDL: 78.3
TRIGLYCERIDES: 30 mg/dL (ref 0.0–149.0)
Total CHOL/HDL Ratio: 3
VLDL: 6 mg/dL (ref 0.0–40.0)

## 2014-09-02 ENCOUNTER — Encounter: Payer: Self-pay | Admitting: Family Medicine

## 2014-09-02 ENCOUNTER — Ambulatory Visit (INDEPENDENT_AMBULATORY_CARE_PROVIDER_SITE_OTHER): Payer: 59 | Admitting: Family Medicine

## 2014-09-02 VITALS — BP 110/66 | HR 65 | Temp 98.1°F | Ht 65.25 in | Wt 125.2 lb

## 2014-09-02 DIAGNOSIS — Z0000 Encounter for general adult medical examination without abnormal findings: Secondary | ICD-10-CM

## 2014-09-02 DIAGNOSIS — Z01419 Encounter for gynecological examination (general) (routine) without abnormal findings: Secondary | ICD-10-CM | POA: Insufficient documentation

## 2014-09-02 DIAGNOSIS — N809 Endometriosis, unspecified: Secondary | ICD-10-CM

## 2014-09-02 DIAGNOSIS — R634 Abnormal weight loss: Secondary | ICD-10-CM

## 2014-09-02 DIAGNOSIS — R7989 Other specified abnormal findings of blood chemistry: Secondary | ICD-10-CM | POA: Insufficient documentation

## 2014-09-02 LAB — T3, FREE: T3, Free: 3.1 pg/mL (ref 2.3–4.2)

## 2014-09-02 LAB — TSH: TSH: 5.29 u[IU]/mL — ABNORMAL HIGH (ref 0.35–4.50)

## 2014-09-02 LAB — T4, FREE: Free T4: 0.89 ng/dL (ref 0.60–1.60)

## 2014-09-02 NOTE — Patient Instructions (Signed)
Good to see you. Happy Holidays. We will call you with your results.

## 2014-09-02 NOTE — Assessment & Plan Note (Signed)
Deteriorated. Check full thyroid panel today. Refer to endocrinology- warrants further work up.  Orders Placed This Encounter  Procedures  . TSH  . T4, Free  . T3, Free  . Ambulatory referral to Endocrinology

## 2014-09-02 NOTE — Assessment & Plan Note (Signed)
Reviewed preventive care protocols, scheduled due services, and updated immunizations Discussed nutrition, exercise, diet, and healthy lifestyle.  

## 2014-09-02 NOTE — Progress Notes (Signed)
Pre visit review using our clinic review tool, if applicable. No additional management support is needed unless otherwise documented below in the visit note. 

## 2014-09-02 NOTE — Progress Notes (Signed)
38 yo pleasant female here to for CPX.  Sees Dr. Dellis Filbert for pap smears, UTD. Has already received her influenza vaccine this year.  Weight loss- unintentional and persistent. Wt Readings from Last 3 Encounters:  09/02/14 125 lb 4 oz (56.813 kg)  03/31/14 127 lb (57.607 kg)  08/26/13 137 lb 4 oz (62.256 kg)   Did have intermittent abdominal pain with h/o hepatic hemangioma- saw Dr. Collene Mares. Last MRI of hepatic hemangioma was on 08/22/12- reassuring, unchanged. Also had significant GI work up- will request full records from Dr. Collene Mares. Colonoscopy 2005, neg hepatobiliary imaging/emptying and [place] in 03/2014. H/o endometriosis, last saw her GYN, Dr. Dellis Filbert, a few months ago.  Per pt, neg transvag US.  Does feel a little full early at times.  Appetite "pretty good," sometimes she does have decreased appetite with her endometriosis. No difficulty swallowing.  Intermittently elevated TSH, TSH elevated again this month.  Was normal last December. +FH thyroid disease (mother).    Lab Results  Component Value Date   CHOL 124 08/31/2014   HDL 45.70 08/31/2014   LDLCALC 72 08/31/2014   TRIG 30.0 08/31/2014   CHOLHDL 3 08/31/2014   Lab Results  Component Value Date   WBC 4.8 08/31/2014   HGB 12.2 08/31/2014   HCT 38.7 08/31/2014   MCV 86.4 08/31/2014   PLT 283.0 08/31/2014   Lab Results  Component Value Date   NA 135 08/31/2014   K 4.4 08/31/2014   CL 105 08/31/2014   CO2 26 08/31/2014   Lab Results  Component Value Date   CREATININE 0.7 08/31/2014     Patient Active Problem List   Diagnosis Date Noted  . Well woman exam 09/02/2014  . Abdominal pain, chronic, right lower quadrant 08/26/2013  . Rhinitis 02/05/2013  . Liver hemangioma 10/03/2011  . Anemia 09/21/2010  . ENDOMETRIOSIS, OVARY 09/21/2010   Past Medical History  Diagnosis Date  . Endometriosis   . Herniated disc    History reviewed. No pertinent past surgical history. History  Substance Use Topics  .  Smoking status: Never Smoker   . Smokeless tobacco: Never Used  . Alcohol Use: No   Family History  Problem Relation Age of Onset  . Depression Other   . Thyroid disease Other    No Known Allergies Current Outpatient Prescriptions on File Prior to Visit  Medication Sig Dispense Refill  . acetaminophen (TYLENOL) 500 MG tablet Take 500 mg by mouth every 6 (six) hours as needed.    . cyclobenzaprine (FLEXERIL) 5 MG tablet Take 1 tablet (5 mg total) by mouth 3 (three) times daily as needed for muscle spasms. 30 tablet 1  . ibuprofen (ADVIL,MOTRIN) 200 MG tablet Take 200 mg by mouth daily.    . Meloxicam (MOBIC PO) Take one by mouth daily- pt unsure of strength    . Probiotic Product (RESTORA PO) Take 1 capsule by mouth daily.     No current facility-administered medications on file prior to visit.   The PMH, PSH, Social History, Family History, Medications, and allergies have been reviewed in [facility], and have been updated if relevant.   Review of Systems       See HPI  + dry skin, + dry hair Does have intermittent changes in her bowel habits No anxiety +intermittent fatigue No difficulty swallowing No breast pain No palpitations Denies feeling depressed No rashes No LE edema No blurred vision No CP or SOB  Physical Exam BP 110/66 mmHg  Pulse 65  Temp(Src)  98.1 F (36.7 C) (Oral)  Ht 5' 5.25" (1.657 m)  Wt 125 lb 4 oz (56.813 kg)  BMI 20.69 kg/m2  SpO2 98%  LMP 08/22/2014 (Within Days) General:  Well-developed,well-nourished,in no acute distress; alert,appropriate and cooperative throughout examination Head:  normocephalic, atraumatic, and no abnormalities observed.   Eyes:  vision grossly intact, pupils equal, pupils round, and pupils reactive to light.   Ears:  R ear normal and L ear normal.   Nose:  no external deformity.   Mouth:  good dentition.   Neck:  No deformities, masses, or tenderness noted. Lungs:  Normal respiratory effort, chest expands symmetrically.  Lungs are clear to auscultation, no crackles or wheezes. Heart:  Normal rate and regular rhythm. S1 and S2 normal without gallop, murmur, click, rub or other extra sounds. Abdomen:  Bowel sounds positive,abdomen soft and non-tender without masses, organomegaly or hernias noted. Msk:  No deformity or scoliosis noted of thoracic or lumbar spine.   Extremities:  no edema Neurologic:  alert & oriented X3 and gait normal.   Skin:  Intact without suspicious lesions or rashes Psych:  Cognition and judgment appear intact. Alert and cooperative with normal attention span and concentration. No apparent delusions, illusions, hallucinations

## 2014-09-02 NOTE — Assessment & Plan Note (Signed)
Persistent issue. Given duration of symptoms, I do agree that this needs to be looked into further.  Could certainly be related to decreased appetite when her endometriosis is causing symptoms, but I cannot rule out other issues. Full GI work up per pt- will request records from Dr. Collene Mares. Refer to endocrinology given fluctuating TSH- although TSH high not low today, she may have a thyroiditis/grave's disease. The patient indicates understanding of these issues and agrees with the plan.

## 2014-09-27 ENCOUNTER — Ambulatory Visit: Payer: 59 | Admitting: Internal Medicine

## 2014-10-12 ENCOUNTER — Ambulatory Visit (INDEPENDENT_AMBULATORY_CARE_PROVIDER_SITE_OTHER): Payer: 59 | Admitting: Endocrinology

## 2014-10-12 ENCOUNTER — Encounter: Payer: Self-pay | Admitting: Endocrinology

## 2014-10-12 VITALS — BP 120/72 | HR 62 | Ht 62.5 in | Wt 127.5 lb

## 2014-10-12 DIAGNOSIS — R7989 Other specified abnormal findings of blood chemistry: Secondary | ICD-10-CM

## 2014-10-12 LAB — T3, FREE: T3, Free: 3 pg/mL (ref 2.3–4.2)

## 2014-10-12 LAB — T4, FREE: Free T4: 0.76 ng/dL (ref 0.60–1.60)

## 2014-10-12 NOTE — Patient Instructions (Signed)
Continue to monitor your symptoms.  Report back promptly if you start to experience any weight loss.   Labs today. Please come back for a follow-up appointment in 3 months

## 2014-10-12 NOTE — Progress Notes (Signed)
Pre visit review using our clinic review tool, if applicable. No additional management support is needed unless otherwise documented below in the visit note. 

## 2014-10-12 NOTE — Assessment & Plan Note (Addendum)
We discussed about thyroid hormone physiology, thyroid function and dysfunction, thyroiditis, autoimmune thyroid disorder.  We discussed about thyroid labs and interpretation.  We discussed about symptoms of thyroid dysfunction.   Typically, chronic abdominal pain would be less likely to be associated with thyroid dysfunction. I have asked her to follow back with her GI for this and consider a trial of gluten free diet for a few weeks. Probiotics are helping her with BM regularity currently.   She has had intermittent high TSH reading in 2014 and now mildly abnormal TSH results in Dec 2015. This could correspond with developing subclinical hypothyroidism versus intermittent thyroiditis. I have asked her to report back promptly, in case she were to have recurrent weight loss.   Recheck thyroid panel and Ab levels. If TSH is trending up, then consider a trial of low dose levothyroxine versus careful monitoring. The patient prefers not to start any thyroid hormone, unless she needs to and wants to see her lab results first.   She was asked to continue to monitor her symptoms and notify if hypo- or hyper- thyroid symptoms occur/worsen.   RTC 3 months

## 2014-10-12 NOTE — Progress Notes (Signed)
Reason for visit: High TSH/ ? Hypothyroidism  HPI  [name] is a 39 y.o.-year-old female, referred by her PCP, Arnette Norris, MD,  for evaluation for high TSH/ ? hypothyroidism.   Patient recalls being diagnosed with abnormal thyroid labs around Dec 2014 when she was suffering from abdominal pain/ bowel habit irregularity. She was tested by her GI for celiac and screen was negative, but TSH level from 08/20/14 was elevated at 5.088. Follow up labs in Dec end 2014 were normal with PCP at 1.69.  The patient has had her TSH level checked in 2015, December and levels, were mildly elevated with normal free T3 and free T4.  She is currently not on thyroid hormone.  She has FH of hypothyroidism in mother.  Denies any recent use of iodine supplements or herbal medications. Denies any tenderness over her thyroid area, denies any nodules. Denies recent iv contrast recently. Denies any recent hospitalization/illness.  She continues to have intermittent flare ups of her abdominal pain, BM irregularity and subsequent weight loss over the past few years. At her best weight around 2008, she was ranging between 135-138 lbs. Her weight is stable recently. She has lost about 10 lbs over the preceding years.  She has 1 daughter. No plans for future pregnancies. No plans to start OCPs.  Other symptoms include hair loss and some cold intolerance.  She also has history of endometriosis that was treated with surgery 2003/2004. Menses are regular now.  No FH of thyroid cancer. No associated hyperlipidemia.   I reviewed pt's thyroid tests: Lab Results  Component Value Date   TSH 5.29* 09/02/2014   TSH 6.59* 08/31/2014   TSH 1.69 09/16/2013   TSH 3.71 09/21/2010   FREET4 0.89 09/02/2014   T3FREE 3.1 09/02/2014     Review of systems: [ x  ] complains of    [  ] denies [  ] weight gain  [ x ] constipation  [  ] fatigue   [  ] dry skin  [ x ] cold intolerance [ x ] hair loss [  ] noticing any enlargement in size  of thyroid [  ] lumps in neck [  ] dysphagia [  ] change in voice [  ]  SOB with lying down.  I have reviewed the patient's past medical history, family and social history, surgical history, medications and allergies.  Past Medical History  Diagnosis Date  . Endometriosis   . Herniated disc    No past surgical history on file. Family History  Problem Relation Age of Onset  . Depression Other   . Thyroid disease Other   . Thyroid disease Mother   . Diabetes Father    History   Social History  . Marital Status: Married    Spouse Name: N/A    Number of Children: 1  . Years of Education: N/A   Occupational History  .  [employer]   Social History Main Topics  . Smoking status: Never Smoker   . Smokeless tobacco: Never Used  . Alcohol Use: No  . Drug Use: No  . Sexual Activity: Not on file   Other Topics Concern  . Not on file   Social History Narrative   Current Outpatient Prescriptions on File Prior to Visit  Medication Sig Dispense Refill  . Meloxicam (MOBIC PO) Take one by mouth daily- pt unsure of strength    . Probiotic Product (RESTORA PO) Take 1 capsule by mouth daily.  No current facility-administered medications on file prior to visit.   No Known Allergies   Review of Systems: [x]  complains of  [  ] denies General:   [  x] Recent weight change [  ] Fatigue  [  ] Loss of appetite Eyes: [  ]  Vision Difficulty [  ]  Eye pain ENT: [  ]  Hearing difficulty [  ]  Difficulty Swallowing CVS: [  ] Chest pain [  ]  Palpitations/Irregular Heart beat [  ]  Shortness of breath lying flat [  ] Swelling of legs Resp: [  ] Frequent Cough [  ] Shortness of Breath  [  ]  Wheezing GI: [  ] Heartburn  [  ] Nausea or Vomiting  [  ] Diarrhea [ x ] Constipation  [ x ] Abdominal Pain GU: [  ]  Polyuria  [  ]  nocturia Bones/joints:  [  ]  Muscle aches  [  ] Joint Pain  [  ] Bone pain Skin/Hair/Nails: [  ]  Rash  [  ] New stretch marks [  ]  Itching [ x ] Hair loss [  ]   Excessive hair growth Reproduction: [  ] Low sexual desire , [  ]  Women: Menstrual cycle problems [  ]  Women: Breast Discharge [  ] Men: Difficulty with erections [  ]  Men: Enlarged Breasts CNS: [ x ] Frequent Headaches [  ] Blurry vision [  ] Tremors [  ] Seizures [  ] Loss of consciousness [  ] Localized weakness Endocrine: [  ]  Excess thirst [  ]  Feeling excessively hot [ ? ]  Feeling excessively cold Heme: [  ]  Easy bruising [  ]  Enlarged glands or lumps in neck Allergy: [  ]  Food allergies [  ] Environmental allergies  PE: BP 120/72 mmHg  Pulse 62  Ht 5' 2.5" (1.588 m)  Wt 127 lb 8 oz (57.834 kg)  BMI 22.93 kg/m2  SpO2 97% Wt Readings from Last 3 Encounters:  10/12/14 127 lb 8 oz (57.834 kg)  09/02/14 125 lb 4 oz (56.813 kg)  03/31/14 127 lb (57.607 kg)    HEENT: [place]/AT, EOMI, no icterus, no proptosis, no chemosis, no mild lid lag, no retraction, eyes close completely Neck: thyroid gland - smooth, non-tender, no erythema, no tracheal deviation; negative Pemberton's sign; no lymphadenopathy; no bruits Lungs: good air entry, clear bilaterally Heart: S1&S2 normal, regular rate & rhythm; no murmurs, rubs or gallops Abd: soft, NT, ND, no HSM, +BS Ext: no tremor in hands bilaterally, no edema, 2+ DP/PT pulses, good muscle mass Neuro: normal gait, 2+ reflexes bilaterally, normal 5/5 strength, no proximal myopathy  Derm: no pretibial myxoedema/skin dryness   ASSESSMENT: 1. High TSH, ? Subclinical hypothyroidism  PLAN:  Problem List Items Addressed This Visit      Other   High serum thyroid stimulating hormone (TSH) - Primary    We discussed about thyroid hormone physiology, thyroid function and dysfunction, thyroiditis, autoimmune thyroid disorder.  We discussed about thyroid labs and interpretation.  We discussed about symptoms of thyroid dysfunction.   Typically, chronic abdominal pain would be less likely to be associated with thyroid dysfunction. I have asked her to  follow back with her GI for this and consider a trial of gluten free diet for a few weeks. Probiotics are helping her with BM regularity currently.   She has had  intermittent high TSH reading in 2014 and now mildly abnormal TSH results in Dec 2015. This could correspond with developing subclinical hypothyroidism versus intermittent thyroiditis. I have asked her to report back promptly, in case she were to have recurrent weight loss.   Recheck thyroid panel and Ab levels. If TSH is trending up, then consider a trial of low dose levothyroxine versus careful monitoring. The patient prefers not to start any thyroid hormone, unless she needs to and wants to see her lab results first.   She was asked to continue to monitor her symptoms and notify if hypo- or hyper- thyroid symptoms occur/worsen.   RTC 3 months       Relevant Orders   Thyroid Profile II   Thyroid stimulating immunoglobulin   Thyroid peroxidase antibody   T4, free   T3, free       -RTC in 3 months  Rayven Rettig [facility]  10/12/2014   2:24 PM

## 2014-10-13 ENCOUNTER — Ambulatory Visit: Payer: 59 | Admitting: Endocrinology

## 2014-10-13 LAB — THYROID PROFILE II
FREE THYROXINE INDEX: 2.2 (ref 1.2–4.9)
T3 UPTAKE RATIO: 27 % (ref 24–39)
T3, Total: 111 ng/dL (ref 71–180)
T4 TOTAL: 8.3 ug/dL (ref 4.5–12.0)
TSH: 4.84 u[IU]/mL — AB (ref 0.450–4.500)

## 2014-10-13 LAB — THYROID PEROXIDASE ANTIBODY: Thyroperoxidase Ab SerPl-aCnc: 1 IU/mL (ref <9)

## 2014-10-14 LAB — THYROID STIMULATING IMMUNOGLOBULIN: TSI: 51 % baseline (ref <140)

## 2015-01-11 ENCOUNTER — Ambulatory Visit: Payer: 59 | Admitting: Endocrinology

## 2015-07-04 ENCOUNTER — Encounter: Payer: Self-pay | Admitting: Family Medicine

## 2015-08-25 ENCOUNTER — Other Ambulatory Visit: Payer: Self-pay | Admitting: Family Medicine

## 2015-08-25 DIAGNOSIS — R7989 Other specified abnormal findings of blood chemistry: Secondary | ICD-10-CM

## 2015-08-25 DIAGNOSIS — Z01419 Encounter for gynecological examination (general) (routine) without abnormal findings: Secondary | ICD-10-CM

## 2015-08-31 ENCOUNTER — Other Ambulatory Visit (INDEPENDENT_AMBULATORY_CARE_PROVIDER_SITE_OTHER): Payer: 59

## 2015-08-31 DIAGNOSIS — R7989 Other specified abnormal findings of blood chemistry: Secondary | ICD-10-CM | POA: Diagnosis not present

## 2015-08-31 DIAGNOSIS — Z01419 Encounter for gynecological examination (general) (routine) without abnormal findings: Secondary | ICD-10-CM

## 2015-08-31 DIAGNOSIS — Z0000 Encounter for general adult medical examination without abnormal findings: Secondary | ICD-10-CM | POA: Diagnosis not present

## 2015-08-31 DIAGNOSIS — Z23 Encounter for immunization: Secondary | ICD-10-CM

## 2015-08-31 LAB — COMPREHENSIVE METABOLIC PANEL
ALK PHOS: 36 U/L — AB (ref 39–117)
ALT: 12 U/L (ref 0–35)
AST: 13 U/L (ref 0–37)
Albumin: 3.8 g/dL (ref 3.5–5.2)
BILIRUBIN TOTAL: 0.7 mg/dL (ref 0.2–1.2)
BUN: 13 mg/dL (ref 6–23)
CO2: 29 mEq/L (ref 19–32)
Calcium: 8.9 mg/dL (ref 8.4–10.5)
Chloride: 107 mEq/L (ref 96–112)
Creatinine, Ser: 0.67 mg/dL (ref 0.40–1.20)
GFR: 104.09 mL/min (ref >60.00)
GLUCOSE: 95 mg/dL (ref 70–99)
Potassium: 4 mEq/L (ref 3.5–5.1)
Sodium: 139 mEq/L (ref 135–145)
TOTAL PROTEIN: 6.7 g/dL (ref 6.0–8.3)

## 2015-08-31 LAB — CBC WITH DIFFERENTIAL/PLATELET
BASOS ABS: 0 10*3/uL (ref 0.0–0.1)
Basophils Relative: 0.4 % (ref 0.0–3.0)
Eosinophils Absolute: 0.1 10*3/uL (ref 0.0–0.7)
Eosinophils Relative: 3.1 % (ref 0.0–5.0)
HCT: 35.8 % — ABNORMAL LOW (ref 36.0–46.0)
Hemoglobin: 11.6 g/dL — ABNORMAL LOW (ref 12.0–15.0)
LYMPHS ABS: 1.8 10*3/uL (ref 0.7–4.0)
LYMPHS PCT: 48.4 % — AB (ref 12.0–46.0)
MCHC: 32.5 g/dL (ref 30.0–36.0)
MCV: 84.5 fl (ref 78.0–100.0)
MONOS PCT: 8.4 % (ref 3.0–12.0)
Monocytes Absolute: 0.3 10*3/uL (ref 0.1–1.0)
NEUTROS PCT: 39.7 % — AB (ref 43.0–77.0)
Neutro Abs: 1.5 10*3/uL (ref 1.4–7.7)
Platelets: 213 10*3/uL (ref 150.0–400.0)
RBC: 4.23 Mil/uL (ref 3.87–5.11)
RDW: 13.6 % (ref 11.5–15.5)
WBC: 3.8 10*3/uL — AB (ref 4.0–10.5)

## 2015-08-31 LAB — TSH: TSH: 6.87 u[IU]/mL — ABNORMAL HIGH (ref 0.35–4.50)

## 2015-08-31 LAB — T4, FREE: Free T4: 0.83 ng/dL (ref 0.60–1.60)

## 2015-08-31 LAB — LIPID PANEL
CHOL/HDL RATIO: 2
Cholesterol: 108 mg/dL (ref 0–200)
HDL: 47.6 mg/dL (ref >39.00)
LDL CALC: 54 mg/dL (ref 0–99)
NONHDL: 60.15
TRIGLYCERIDES: 31 mg/dL (ref 0.0–149.0)
VLDL: 6.2 mg/dL (ref 0.0–40.0)

## 2015-08-31 LAB — VITAMIN D 25 HYDROXY (VIT D DEFICIENCY, FRACTURES): VITD: 9.09 ng/mL — ABNORMAL LOW (ref 30.00–100.00)

## 2015-09-06 ENCOUNTER — Ambulatory Visit (INDEPENDENT_AMBULATORY_CARE_PROVIDER_SITE_OTHER): Payer: 59 | Admitting: Family Medicine

## 2015-09-06 ENCOUNTER — Encounter: Payer: Self-pay | Admitting: Family Medicine

## 2015-09-06 VITALS — BP 100/66 | HR 78 | Temp 98.0°F | Ht 65.5 in | Wt 129.5 lb

## 2015-09-06 DIAGNOSIS — Z0001 Encounter for general adult medical examination with abnormal findings: Secondary | ICD-10-CM

## 2015-09-06 DIAGNOSIS — E559 Vitamin D deficiency, unspecified: Secondary | ICD-10-CM | POA: Insufficient documentation

## 2015-09-06 DIAGNOSIS — E039 Hypothyroidism, unspecified: Secondary | ICD-10-CM | POA: Diagnosis not present

## 2015-09-06 DIAGNOSIS — R300 Dysuria: Secondary | ICD-10-CM | POA: Diagnosis not present

## 2015-09-06 DIAGNOSIS — D649 Anemia, unspecified: Secondary | ICD-10-CM | POA: Insufficient documentation

## 2015-09-06 DIAGNOSIS — Z01419 Encounter for gynecological examination (general) (routine) without abnormal findings: Secondary | ICD-10-CM

## 2015-09-06 DIAGNOSIS — R7989 Other specified abnormal findings of blood chemistry: Secondary | ICD-10-CM | POA: Diagnosis not present

## 2015-09-06 LAB — POCT URINALYSIS DIPSTICK
BILIRUBIN UA: NEGATIVE
GLUCOSE UA: NEGATIVE
KETONES UA: NEGATIVE
Nitrite, UA: NEGATIVE
PH UA: 5.5
Protein, UA: NEGATIVE
RBC UA: NEGATIVE
Urobilinogen, UA: 0.2

## 2015-09-06 MED ORDER — VITAMIN D (ERGOCALCIFEROL) 1.25 MG (50000 UNIT) PO CAPS: 50000.0000 [IU] | ORAL_CAPSULE | ORAL | Status: DC

## 2015-09-06 MED ORDER — LEVOTHYROXINE SODIUM 25 MCG PO TABS: 25.0000 ug | ORAL_TABLET | Freq: Every day | ORAL | Status: DC

## 2015-09-06 NOTE — Assessment & Plan Note (Signed)
Recheck CBC in 2 months, Mild.

## 2015-09-06 NOTE — Assessment & Plan Note (Signed)
Replete with Vit D 50,000 IU weekly x 12 weeks. Follow up labs in 2 months.

## 2015-09-06 NOTE — Progress Notes (Signed)
39 yo pleasant female here to for CPX.  Sees Dr. Dellis Filbert for pap smears, UTD. Has already received her influenza vaccine this year.  Weight loss- unintentional and persistent. Wt Readings from Last 3 Encounters:  09/06/15 129 lb 8 oz (58.741 kg)  10/12/14 127 lb 8 oz (57.834 kg)  09/02/14 125 lb 4 oz (56.813 kg)   Did have intermittent abdominal pain with h/o hepatic hemangioma- saw Dr. Collene Mares. Last MRI of hepatic hemangioma was on 08/22/12- reassuring, unchanged. Also had significant GI work up- will request full records from Dr. Collene Mares. Colonoscopy 2005, neg hepatobiliary imaging/emptying and [place] in 03/2014.  Elevated TSH- saw Dr. Howell Rucks in 09/2014- given unintentional weight loss, endo referral prudent. Note reviewed. Rechecked thyroid panel- antibodies normal so advised short term follow up. TSH elevated again this month.  She is complaining of fatigue and hair thinning.  Denies any other symptoms of hypothyroidism.   Lab Results  Component Value Date   TSH 6.87* 08/31/2015   Low Vit D-  9.09 this month. She has had more myalgias and fatigue.  She has had some mild dysuria for past 2 days.  No flank pain, no nausea or vomiting. Lab Results  Component Value Date   CHOL 108 08/31/2015   HDL 47.60 08/31/2015   LDLCALC 54 08/31/2015   TRIG 31.0 08/31/2015   CHOLHDL 2 08/31/2015   Mild anemia- has been bleeding more heavily with her fibroid.  Also remains a vegetarian. Lab Results  Component Value Date   WBC 3.8* 08/31/2015   HGB 11.6* 08/31/2015   HCT 35.8* 08/31/2015   MCV 84.5 08/31/2015   PLT 213.0 08/31/2015   Lab Results  Component Value Date   NA 139 08/31/2015   K 4.0 08/31/2015   CL 107 08/31/2015   CO2 29 08/31/2015   Lab Results  Component Value Date   CREATININE 0.67 08/31/2015     Patient Active Problem List   Diagnosis Date Noted  . Well woman exam 09/02/2014  . Unintentional weight loss 09/02/2014  . Endometriosis 09/02/2014  . High serum thyroid  stimulating hormone (TSH) 09/02/2014  . Abdominal pain, chronic, right lower quadrant 08/26/2013  . Rhinitis 02/05/2013  . Liver hemangioma 10/03/2011  . Anemia 09/21/2010  . ENDOMETRIOSIS, OVARY 09/21/2010   Past Medical History  Diagnosis Date  . Endometriosis   . Herniated disc    No past surgical history on file. Social History  Substance Use Topics  . Smoking status: Never Smoker   . Smokeless tobacco: Never Used  . Alcohol Use: No   Family History  Problem Relation Age of Onset  . Depression Other   . Thyroid disease Other   . Thyroid disease Mother   . Diabetes Father    No Known Allergies Current Outpatient Prescriptions on File Prior to Visit  Medication Sig Dispense Refill  . Probiotic Product (RESTORA PO) Take 1 capsule by mouth daily.    . Meloxicam (MOBIC PO) Reported on 09/06/2015     No current facility-administered medications on file prior to visit.   The PMH, PSH, Social History, Family History, Medications, and allergies have been reviewed in [facility], and have been updated if relevant.   Review of Systems  Constitutional: Positive for fatigue. Negative for fever.  HENT: Negative.   Eyes: Negative.   Respiratory: Negative.   Cardiovascular: Negative.   Endocrine: Negative.   Genitourinary: Positive for dysuria, urgency and frequency. Negative for flank pain, vaginal bleeding, vaginal discharge, enuresis, difficulty urinating and vaginal  pain.  Musculoskeletal: Negative.   Skin: Negative.   Allergic/Immunologic: Negative.   Neurological: Negative.   Hematological: Negative.   Psychiatric/Behavioral: Negative.   All other systems reviewed and are negative.    Physical Exam BP 100/66 mmHg  Pulse 78  Temp(Src) 98 F (36.7 C) (Oral)  Ht 5' 5.5" (1.664 m)  Wt 129 lb 8 oz (58.741 kg)  BMI 21.21 kg/m2  LMP 08/16/2015 General:  Well-developed,well-nourished,in no acute distress; alert,appropriate and cooperative throughout examination Head:   normocephalic, atraumatic, and no abnormalities observed.   Eyes:  vision grossly intact, pupils equal, pupils round, and pupils reactive to light.   Ears:  R ear normal and L ear normal.   Nose:  no external deformity.   Mouth:  good dentition.   Neck:  No deformities, masses, or tenderness noted. Lungs:  Normal respiratory effort, chest expands symmetrically. Lungs are clear to auscultation, no crackles or wheezes. Heart:  Normal rate and regular rhythm. S1 and S2 normal without gallop, murmur, click, rub or other extra sounds. Abdomen:  Bowel sounds positive,abdomen soft and non-tender without masses, organomegaly or hernias noted. Msk:  No deformity or scoliosis noted of thoracic or lumbar spine.   Extremities:  no edema Neurologic:  alert & oriented X3 and gait normal.   Skin:  Intact without suspicious lesions or rashes Psych:  Cognition and judgment appear intact. Alert and cooperative with normal attention span and concentration. No apparent delusions, illusions, hallucinations

## 2015-09-06 NOTE — Assessment & Plan Note (Signed)
Deteriorated. Start synthroid 25 mcg daily. Recheck thyroid panel in 2 months. The patient indicates understanding of these issues and agrees with the plan.

## 2015-09-06 NOTE — Assessment & Plan Note (Addendum)
New- UA positive for LE only. Send urine for cx. Push water, eliminate bladder irritants.

## 2015-09-06 NOTE — Progress Notes (Signed)
Pre visit review using our clinic review tool, if applicable. No additional management support is needed unless otherwise documented below in the visit note. 

## 2015-09-06 NOTE — Assessment & Plan Note (Signed)
Reviewed preventive care protocols, scheduled due services, and updated immunizations Discussed nutrition, exercise, diet, and healthy lifestyle.  

## 2015-09-06 NOTE — Patient Instructions (Addendum)
Great to see you. Happy Holidays.  We are starting synthroid 25 mcg daily.  Please take 30 minutes prior to eating.  Take Vit D 50,000 IU weekly (once a week).  Please return in 2 months for labs.

## 2015-09-07 LAB — URINE CULTURE

## 2015-10-28 ENCOUNTER — Other Ambulatory Visit: Payer: Self-pay | Admitting: *Deleted

## 2015-10-28 MED ORDER — LEVOTHYROXINE SODIUM 25 MCG PO TABS: 25.0000 ug | ORAL_TABLET | Freq: Every day | ORAL | Status: DC

## 2015-10-28 NOTE — Telephone Encounter (Signed)
Last labs elevated. pls advise

## 2015-10-28 NOTE — Telephone Encounter (Signed)
Noted.  Good observation.  Ok to refill but needs to return for TSH, FT4- future labs already ordered.

## 2015-11-09 ENCOUNTER — Other Ambulatory Visit (INDEPENDENT_AMBULATORY_CARE_PROVIDER_SITE_OTHER): Payer: 59

## 2015-11-09 ENCOUNTER — Encounter: Payer: Self-pay | Admitting: Family Medicine

## 2015-11-09 DIAGNOSIS — E559 Vitamin D deficiency, unspecified: Secondary | ICD-10-CM

## 2015-11-09 DIAGNOSIS — E039 Hypothyroidism, unspecified: Secondary | ICD-10-CM | POA: Diagnosis not present

## 2015-11-09 DIAGNOSIS — Z01419 Encounter for gynecological examination (general) (routine) without abnormal findings: Secondary | ICD-10-CM

## 2015-11-09 DIAGNOSIS — Z0000 Encounter for general adult medical examination without abnormal findings: Secondary | ICD-10-CM

## 2015-11-09 LAB — CBC WITH DIFFERENTIAL/PLATELET
BASOS ABS: 0 10*3/uL (ref 0.0–0.1)
BASOS PCT: 0.4 % (ref 0.0–3.0)
EOS ABS: 0.1 10*3/uL (ref 0.0–0.7)
Eosinophils Relative: 3.1 % (ref 0.0–5.0)
HEMATOCRIT: 36.8 % (ref 36.0–46.0)
Hemoglobin: 12.1 g/dL (ref 12.0–15.0)
LYMPHS ABS: 2 10*3/uL (ref 0.7–4.0)
LYMPHS PCT: 47.2 % — AB (ref 12.0–46.0)
MCHC: 32.9 g/dL (ref 30.0–36.0)
MCV: 84.5 fl (ref 78.0–100.0)
Monocytes Absolute: 0.3 10*3/uL (ref 0.1–1.0)
Monocytes Relative: 7.2 % (ref 3.0–12.0)
NEUTROS ABS: 1.8 10*3/uL (ref 1.4–7.7)
Neutrophils Relative %: 42.1 % — ABNORMAL LOW (ref 43.0–77.0)
PLATELETS: 213 10*3/uL (ref 150.0–400.0)
RBC: 4.35 Mil/uL (ref 3.87–5.11)
RDW: 14.1 % (ref 11.5–15.5)
WBC: 4.3 10*3/uL (ref 4.0–10.5)

## 2015-11-09 LAB — VITAMIN D 25 HYDROXY (VIT D DEFICIENCY, FRACTURES): VITD: 36.44 ng/mL (ref 30.00–100.00)

## 2015-11-09 LAB — T4, FREE: Free T4: 0.95 ng/dL (ref 0.60–1.60)

## 2015-11-09 LAB — TSH: TSH: 3.98 u[IU]/mL (ref 0.35–4.50)

## 2015-11-28 ENCOUNTER — Other Ambulatory Visit: Payer: Self-pay | Admitting: Family Medicine

## 2015-11-28 NOTE — Telephone Encounter (Signed)
Is pt needing to continue high dose? Last labs 10/2015

## 2015-12-02 ENCOUNTER — Other Ambulatory Visit: Payer: Self-pay | Admitting: *Deleted

## 2015-12-02 MED ORDER — LEVOTHYROXINE SODIUM 25 MCG PO TABS: 25.0000 ug | ORAL_TABLET | Freq: Every day | ORAL | Status: DC

## 2016-02-21 ENCOUNTER — Other Ambulatory Visit: Payer: Self-pay | Admitting: Family Medicine

## 2016-02-24 ENCOUNTER — Ambulatory Visit (INDEPENDENT_AMBULATORY_CARE_PROVIDER_SITE_OTHER): Payer: 59 | Admitting: Family Medicine

## 2016-02-24 ENCOUNTER — Encounter: Payer: Self-pay | Admitting: Family Medicine

## 2016-02-24 VITALS — BP 100/76 | HR 80 | Temp 98.4°F | Ht 65.5 in | Wt 129.0 lb

## 2016-02-24 DIAGNOSIS — R1084 Generalized abdominal pain: Secondary | ICD-10-CM

## 2016-02-24 DIAGNOSIS — G8929 Other chronic pain: Secondary | ICD-10-CM | POA: Diagnosis not present

## 2016-02-24 DIAGNOSIS — M25562 Pain in left knee: Secondary | ICD-10-CM

## 2016-02-24 NOTE — Progress Notes (Signed)
Subjective:    Patient ID: [name], female    DOB: 1976-04-17, 40 y.o.   MRN: [id number]  HPI  40 year old female pt of Dr. Hulen Shouts presents with 2 complaints. 1. Abdominal pain, chronic. She has ache, occ cramping from epigastrum to lower abdomen. Feels similar to usual pain, but is lasting longer. Ongoing 3-4 days. Started 1 week after menses. Did have softer BM today. Feels better with BM.  When gas expelled she feels better. No N/V, no blood in stool.  Eats a lot of fruit and veggies, occ water.  She has been diagnosed with IBS in past.. On probiotic. She has history of endometriosis. [place] at GYN at [facility]: Stable [place] except small fibroid See is followed by Dr. Collene Mares, GI. She has had HIDA scan 2015: nml. 2013 MRI abd: stable liver hemangioma.  2.left knee pain: x 2 weeks. No injury, no fall.  When sitting for a while she has pain in left lalteral knee. No swelling, or redness.  No pain with walking.  No instability, no clicking , no popping, no grinding.  Minimal exercise. No history of knee issues.  Has tried motrin and  Warm compression, helped some.   Social History /Family History/Past Medical History reviewed and updated if needed.  Review of Systems  Constitutional: Negative for fever and fatigue.  HENT: Negative for ear pain.   Eyes: Negative for pain.  Respiratory: Negative for chest tightness and shortness of breath.   Cardiovascular: Negative for chest pain, palpitations and leg swelling.  Gastrointestinal: Positive for abdominal pain.  Genitourinary: Negative for dysuria.       Objective:   Physical Exam  Constitutional: Vital signs are normal. She appears well-developed and well-nourished. She is cooperative.  Non-toxic appearance. She does not appear ill. No distress.  HENT:  Head: Normocephalic.  Right Ear: Hearing, tympanic membrane, external ear and ear canal normal. Tympanic membrane is not erythematous, not retracted and not bulging.  Left  Ear: Hearing, tympanic membrane, external ear and ear canal normal. Tympanic membrane is not erythematous, not retracted and not bulging.  Nose: No mucosal edema or rhinorrhea. Right sinus exhibits no maxillary sinus tenderness and no frontal sinus tenderness. Left sinus exhibits no maxillary sinus tenderness and no frontal sinus tenderness.  Mouth/Throat: Uvula is midline, oropharynx is clear and moist and mucous membranes are normal.  Eyes: Conjunctivae, EOM and lids are normal. Pupils are equal, round, and reactive to light. Lids are everted and swept, no foreign bodies found.  Neck: Trachea normal and normal range of motion. Neck supple. Carotid bruit is not present. No thyroid mass and no thyromegaly present.  Cardiovascular: Normal rate, regular rhythm, S1 normal, S2 normal, normal heart sounds, intact distal pulses and normal pulses.  Exam reveals no gallop and no friction rub.   No murmur heard. Pulmonary/Chest: Effort normal and breath sounds normal. No tachypnea. No respiratory distress. She has no decreased breath sounds. She has no wheezes. She has no rhonchi. She has no rales.  Abdominal: Soft. Normal appearance and bowel sounds are normal. There is no tenderness.  Musculoskeletal:       Left knee: She exhibits bony tenderness. She exhibits normal range of motion and no effusion. No tenderness found. No medial joint line, no lateral joint line, no MCL, no LCL and no patellar tendon tenderness noted.  ttp over left tibial tuberosity  Neurological: She is alert.  Skin: Skin is warm, dry and intact. No rash noted.  Psychiatric: Her  speech is normal and behavior is normal. Judgment and thought content normal. Her mood appears not anxious. Cognition and memory are normal. She does not exhibit a depressed mood.          Assessment & Plan:

## 2016-02-24 NOTE — Assessment & Plan Note (Addendum)
This is not a new issue for pt. She has seen PCP, GI and GYN in past.  Description sounds most consistent with IBS as well as possibly endometriosis involvement. No current tenderness on exam  Recommended daily probiotics, fiber and water in diet. Info given on IBS and fiber intake.  Follow up with GI if not improving as expected.

## 2016-02-24 NOTE — Progress Notes (Signed)
Pre visit review using our clinic review tool, if applicable. No additional management support is needed unless otherwise documented below in the visit note. 

## 2016-02-24 NOTE — Assessment & Plan Note (Signed)
Symptoms consistent with patellofemoral except for ttp over tibial tuberosity Will treat with  Glucosmine, NSAIDs, home PT.  Follow up with PCP if not improving.

## 2016-02-24 NOTE — Patient Instructions (Addendum)
Pain in knee likely from patellofemoral syndrome and irritation at tibial tuberosity.  Start glucosamine 500 mg 1-3 times daily as needed.  Start home strengthening exercises.  Can use ibuprofen as need, on full stomach. Start fiber and take daily probiotic. Avoid greasy foods and work on decreasing stress.  Follow up with PCP if not improving as expected.

## 2016-05-22 ENCOUNTER — Telehealth: Payer: Self-pay | Admitting: Family Medicine

## 2016-05-22 NOTE — Telephone Encounter (Signed)
TH scheduled appt 05/23/16 at 3;30 with Dr Damita Dunnings.

## 2016-05-22 NOTE — Telephone Encounter (Signed)
Patient Name: [name] DOB: 1976/03/08 Initial Comment Caller states c/o abdominal pain. Nurse Assessment Nurse: Roosvelt Maser, RN, Barnetta Chapel Date/Time (Eastern Time): 05/22/2016 2:42:50 PM Confirm and document reason for call. If symptomatic, describe symptoms. You must click the next button to save text entered. ---abd pain for months off/on, started this morning, 8/10 when its the worst, no fever Has the patient traveled out of the country within the last 30 days? ---Not Applicable Does the patient have any new or worsening symptoms? ---Yes Will a triage be completed? ---Yes Related visit to physician within the last 2 weeks? ---No Does the PT have any chronic conditions? (i.e. diabetes, asthma, etc.) ---No Is the patient pregnant or possibly pregnant? (Ask all females between the ages of 89-55) ---No Is this a behavioral health or substance abuse call? ---No Guidelines Guideline Title Affirmed Question Affirmed Notes Abdominal Pain - Female [1] MODERATE (e.g., interferes with normal activities) AND [2] pain comes and goes (cramps) AND [3] present > 24 hours (Exception: pain with Vomiting or Diarrhea - see that Guideline) Final Disposition User See Physician within 24 Hours Roosvelt Maser, RN, [employer] Referrals REFERRED TO PCP OFFICE Disagree/Comply: Leta Baptist

## 2016-05-23 ENCOUNTER — Ambulatory Visit: Payer: Self-pay | Admitting: Family Medicine

## 2016-05-23 NOTE — Telephone Encounter (Signed)
Will see at OV.  Thanks.  

## 2016-05-24 ENCOUNTER — Other Ambulatory Visit: Payer: Self-pay | Admitting: Gastroenterology

## 2016-05-24 DIAGNOSIS — R1084 Generalized abdominal pain: Secondary | ICD-10-CM

## 2016-06-05 ENCOUNTER — Ambulatory Visit
Admission: RE | Admit: 2016-06-05 | Discharge: 2016-06-05 | Disposition: A | Discharge status: Home / Self Care | Payer: 59 | Source: Ambulatory Visit | Attending: Gastroenterology | Admitting: Gastroenterology

## 2016-06-05 DIAGNOSIS — R1084 Generalized abdominal pain: Secondary | ICD-10-CM

## 2016-06-05 MED ORDER — IOPAMIDOL (ISOVUE-300) INJECTION 61%
100.0000 mL | Freq: Once | INTRAVENOUS | Status: AC | PRN
Start: 2016-06-05 — End: 2016-06-05
  Administered 2016-06-05: 100 mL via INTRAVENOUS

## 2016-07-02 ENCOUNTER — Other Ambulatory Visit: Payer: Self-pay | Admitting: Family Medicine

## 2016-08-14 ENCOUNTER — Ambulatory Visit (INDEPENDENT_AMBULATORY_CARE_PROVIDER_SITE_OTHER): Payer: 59

## 2016-08-14 ENCOUNTER — Ambulatory Visit: Payer: 59

## 2016-08-14 DIAGNOSIS — Z23 Encounter for immunization: Secondary | ICD-10-CM | POA: Diagnosis not present

## 2016-11-08 ENCOUNTER — Other Ambulatory Visit: Payer: Self-pay | Admitting: Family Medicine

## 2016-11-08 ENCOUNTER — Other Ambulatory Visit (INDEPENDENT_AMBULATORY_CARE_PROVIDER_SITE_OTHER): Payer: BLUE CROSS/BLUE SHIELD

## 2016-11-08 DIAGNOSIS — R7989 Other specified abnormal findings of blood chemistry: Secondary | ICD-10-CM

## 2016-11-08 DIAGNOSIS — Z01419 Encounter for gynecological examination (general) (routine) without abnormal findings: Secondary | ICD-10-CM

## 2016-11-08 LAB — COMPREHENSIVE METABOLIC PANEL
ALBUMIN: 4.2 g/dL (ref 3.5–5.2)
ALT: 11 U/L (ref 0–35)
AST: 15 U/L (ref 0–37)
Alkaline Phosphatase: 38 U/L — ABNORMAL LOW (ref 39–117)
BUN: 11 mg/dL (ref 6–23)
CHLORIDE: 105 meq/L (ref 96–112)
CO2: 28 meq/L (ref 19–32)
Calcium: 9.2 mg/dL (ref 8.4–10.5)
Creatinine, Ser: 0.73 mg/dL (ref 0.40–1.20)
GFR: 93.71 mL/min (ref >60.00)
Glucose, Bld: 90 mg/dL (ref 70–99)
POTASSIUM: 4.1 meq/L (ref 3.5–5.1)
SODIUM: 135 meq/L (ref 135–145)
Total Bilirubin: 0.9 mg/dL (ref 0.2–1.2)
Total Protein: 7.2 g/dL (ref 6.0–8.3)

## 2016-11-08 LAB — CBC WITH DIFFERENTIAL/PLATELET
BASOS ABS: 0 10*3/uL (ref 0.0–0.1)
BASOS PCT: 0.4 % (ref 0.0–3.0)
EOS ABS: 0.1 10*3/uL (ref 0.0–0.7)
Eosinophils Relative: 2.8 % (ref 0.0–5.0)
HCT: 37.9 % (ref 36.0–46.0)
Hemoglobin: 12.1 g/dL (ref 12.0–15.0)
LYMPHS ABS: 1.5 10*3/uL (ref 0.7–4.0)
Lymphocytes Relative: 43.2 % (ref 12.0–46.0)
MCHC: 31.9 g/dL (ref 30.0–36.0)
MCV: 84.9 fl (ref 78.0–100.0)
MONO ABS: 0.3 10*3/uL (ref 0.1–1.0)
Monocytes Relative: 8.8 % (ref 3.0–12.0)
NEUTROS ABS: 1.6 10*3/uL (ref 1.4–7.7)
NEUTROS PCT: 44.8 % (ref 43.0–77.0)
Platelets: 220 10*3/uL (ref 150.0–400.0)
RBC: 4.47 Mil/uL (ref 3.87–5.11)
RDW: 14 % (ref 11.5–15.5)
WBC: 3.6 10*3/uL — ABNORMAL LOW (ref 4.0–10.5)

## 2016-11-08 LAB — LIPID PANEL
CHOL/HDL RATIO: 3
CHOLESTEROL: 115 mg/dL (ref 0–200)
HDL: 45.9 mg/dL (ref >39.00)
LDL CALC: 63 mg/dL (ref 0–99)
NonHDL: 68.91
TRIGLYCERIDES: 31 mg/dL (ref 0.0–149.0)
VLDL: 6.2 mg/dL (ref 0.0–40.0)

## 2016-11-08 LAB — TSH: TSH: 3.4 u[IU]/mL (ref 0.35–4.50)

## 2016-11-11 ENCOUNTER — Other Ambulatory Visit: Payer: Self-pay | Admitting: Family Medicine

## 2016-11-14 ENCOUNTER — Encounter: Payer: Self-pay | Admitting: Family Medicine

## 2016-11-14 ENCOUNTER — Ambulatory Visit (INDEPENDENT_AMBULATORY_CARE_PROVIDER_SITE_OTHER): Payer: BLUE CROSS/BLUE SHIELD | Admitting: Family Medicine

## 2016-11-14 ENCOUNTER — Encounter: Payer: 59 | Admitting: Family Medicine

## 2016-11-14 VITALS — BP 120/80 | HR 78 | Ht 65.0 in | Wt 117.2 lb

## 2016-11-14 DIAGNOSIS — R634 Abnormal weight loss: Secondary | ICD-10-CM | POA: Diagnosis not present

## 2016-11-14 DIAGNOSIS — Z0001 Encounter for general adult medical examination with abnormal findings: Secondary | ICD-10-CM

## 2016-11-14 DIAGNOSIS — E039 Hypothyroidism, unspecified: Secondary | ICD-10-CM | POA: Insufficient documentation

## 2016-11-14 NOTE — Assessment & Plan Note (Signed)
Euthyroid on current dose of synthroid. No changes made today. 

## 2016-11-14 NOTE — Assessment & Plan Note (Signed)
Reviewed preventive care protocols, scheduled due services, and updated immunizations Discussed nutrition, exercise, diet, and healthy lifestyle.  

## 2016-11-14 NOTE — Patient Instructions (Signed)
Great to see you.  I will contact Dr. Lorie Apley office today.  Please call to schedule your mammogram.  Bentyl (Lomitil) is the name of the medication I told you about.

## 2016-11-14 NOTE — Progress Notes (Signed)
Subjective:   Patient ID: [name], female    DOB: September 14, 1976, 41 y.o.   MRN: [id number]  [name] is a pleasant 41 y.o. year old female who presents to clinic today with Annual Exam (had a colonoscopy on 10/08/16, weight loss )  on 11/14/2016  HPI:   Has GYN- Dr. Dellis Filbert. Per pt, pap smear and mammogram UTD.  Hypothyroidism- has been euthyroid on low dose synthroid.  Denies symptoms of hypo or hyperthyroidism.  Lab Results  Component Value Date   TSH 3.40 11/08/2016   She has lost a significant amount of weight since I last saw her.  This is being worked up by Dr. Collene Mares.  ?IBS per pt. States she is afraid to eat but shortly after eating anything, she has abdominal cramping and loose stools.  Stools often have mucous, no blood. No nausea or vomiting. No early satiety.  Certain foods do not seem to make it worse.  She is still taking a probiotic.  Wt Readings from Last 3 Encounters:  11/14/16 117 lb 4 oz (53.2 kg)  02/24/16 129 lb (58.5 kg)  09/06/15 129 lb 8 oz (58.7 kg)    Hepatic hemangioma- saw Dr. Collene Mares. Last MRI of hepatic hemangioma was on 08/22/12- reassuring, unchanged. Also had significant GI work up- will request full records from Dr. Collene Mares. Colonoscopy 2005, neg hepatobiliary imaging/emptying and [place] in 03/2014. She had another colonoscopy on 10/08/16  Lab Results  Component Value Date   WBC 3.6 (L) 11/08/2016   HGB 12.1 11/08/2016   HCT 37.9 11/08/2016   MCV 84.9 11/08/2016   PLT 220.0 11/08/2016   Lab Results  Component Value Date   NA 135 11/08/2016   K 4.1 11/08/2016   CL 105 11/08/2016   CO2 28 11/08/2016   Lab Results  Component Value Date   ALT 11 11/08/2016   AST 15 11/08/2016   ALKPHOS 38 (L) 11/08/2016   BILITOT 0.9 11/08/2016   Lab Results  Component Value Date   CREATININE 0.73 11/08/2016   Lab Results  Component Value Date   CHOL 115 11/08/2016   HDL 45.90 11/08/2016   LDLCALC 63 11/08/2016   TRIG 31.0 11/08/2016   CHOLHDL 3  11/08/2016    Current Outpatient Prescriptions on File Prior to Visit  Medication Sig Dispense Refill  . levothyroxine (SYNTHROID, LEVOTHROID) 25 MCG tablet Take 1 tablet (25 mcg total) by mouth daily before breakfast. 90 tablet 2  . levothyroxine (SYNTHROID, LEVOTHROID) 25 MCG tablet TAKE 1 TABLET (25 MCG TOTAL) BY MOUTH DAILY BEFORE BREAKFAST. 90 tablet 1  . Probiotic Product (RESTORA PO) Take 1 capsule by mouth daily.    . Vitamin D, Ergocalciferol, (DRISDOL) 50000 units CAPS capsule TAKE 1 CAPSULE (50,000 UNITS TOTAL) BY MOUTH EVERY 7 (SEVEN) DAYS. 12 capsule 0   No current facility-administered medications on file prior to visit.     No Known Allergies  Past Medical History:  Diagnosis Date  . Endometriosis   . Herniated disc     No past surgical history on file.  Family History  Problem Relation Age of Onset  . Depression Other   . Thyroid disease Other   . Thyroid disease Mother   . Diabetes Father     Social History   Social History  . Marital status: Married    Spouse name: N/A  . Number of children: 1  . Years of education: N/A   Occupational History  .  [employer]   Social History  Main Topics  . Smoking status: Never Smoker  . Smokeless tobacco: Never Used  . Alcohol use No  . Drug use: No  . Sexual activity: Not on file   Other Topics Concern  . Not on file   Social History Narrative  . No narrative on file   The PMH, PSH, Social History, Family History, Medications, and allergies have been reviewed in [facility], and have been updated if relevant.   Review of Systems  Constitutional: Positive for unexpected weight change.  HENT: Negative.   Respiratory: Negative.   Cardiovascular: Negative.   Gastrointestinal: Positive for abdominal distention and diarrhea. Negative for abdominal pain, anal bleeding, blood in stool, constipation, nausea, rectal pain and vomiting.  Endocrine: Negative.   Genitourinary: Negative.   Musculoskeletal: Negative.     Allergic/Immunologic: Negative.   Neurological: Negative.   Hematological: Negative.   Psychiatric/Behavioral: Negative.   All other systems reviewed and are negative.      Objective:    BP 120/80   Pulse 78   Ht 5\' 5"  (1.651 m)   Wt 117 lb 4 oz (53.2 kg)   LMP 11/12/2016   SpO2 99%   BMI 19.51 kg/m   Wt Readings from Last 3 Encounters:  11/14/16 117 lb 4 oz (53.2 kg)  02/24/16 129 lb (58.5 kg)  09/06/15 129 lb 8 oz (58.7 kg)    Physical Exam  Constitutional: She is oriented to person, place, and time. She appears well-developed and well-nourished. No distress.  HENT:  Head: Normocephalic and atraumatic.  Eyes: Conjunctivae are normal.  Neck: Neck supple.  Cardiovascular: Normal rate, regular rhythm and normal heart sounds.   Pulmonary/Chest: Effort normal and breath sounds normal.  Abdominal: Soft. Bowel sounds are normal. She exhibits no distension. There is no tenderness.  Musculoskeletal: Normal range of motion. She exhibits no edema.  Neurological: She is alert and oriented to person, place, and time. No cranial nerve deficit. Coordination normal.  Skin: Skin is warm and dry. She is not diaphoretic.  Psychiatric: She has a normal mood and affect. Her behavior is normal. Judgment and thought content normal.  Nursing note and vitals reviewed.         Assessment & Plan:   Well woman exam  Hypothyroidism, unspecified type No Follow-up on file.

## 2016-11-14 NOTE — Assessment & Plan Note (Signed)
Concerning but reassured by negative colonoscopy and recent neg CT of abdomen and pelvis.  Will contact Dr. Lorie Apley office to request records. ? Bentyl may help.  I will inquire about this. The patient indicates understanding of these issues and agrees with the plan.

## 2016-12-31 ENCOUNTER — Other Ambulatory Visit: Payer: Self-pay | Admitting: Family Medicine

## 2017-02-07 ENCOUNTER — Other Ambulatory Visit: Payer: Self-pay | Admitting: Family Medicine

## 2017-02-07 NOTE — Telephone Encounter (Signed)
Last refill 11/12/16

## 2017-02-22 ENCOUNTER — Telehealth: Payer: Self-pay | Admitting: Family Medicine

## 2017-02-22 NOTE — Telephone Encounter (Signed)
TH had scheduled appt on 03/04/17; I spoke with pt to verify she wanted to wait until 03/04/17 to be seen. Pt said no she wanted an appt on 02/25/17 in afternoon. Rescheduled appt on 02/25/17 at 2:30 pm. Pt voiced understanding. FYI to Dr Deborra Medina.

## 2017-02-22 NOTE — Telephone Encounter (Signed)
[facility] Patient Name: [name] DOB: [birth year] Initial Comment Caller is having lower abdomen pain. Nurse Assessment Nurse: Jimmey Ralph, RN, Lissa Date/Time (Eastern Time): 02/22/2017 3:20:42 PM Confirm and document reason for call. If symptomatic, describe symptoms. ---Caller states: The symptoms started morning and not constant in lower right side; is having lower abdomen pain. I thought it was gas pain or nerve pain and staying in one spot and when having pain it is 3-4/10. No vomiting. Soft stool for about one year. No fever. Does the patient have any new or worsening symptoms? ---Yes Will a triage be completed? ---YesRelated visit to physician within the last 2 weeks? ---No Does the PT have any chronic conditions? (i.e. diabetes, asthma, etc.) ---Yes List chronic conditions. ---Stomach issues but cleared medically Thyroid Is the patient pregnant or possibly pregnant? (Ask all females between the ages of 47-55) ---No Is this a behavioral health or substance abuse call? ---No Guidelines Guideline Title Affirmed Question Affirmed Notes Abdominal Pain - Female [1] MILD-MODERATE pain AND [2] comes and goes (cramps) Final Disposition User [facility], RN, Lissa Comments Caller is requesting appointment in office today any time or monday after2:30p. Referrals REFERRED TO PCP OFFICE Disagree/Comply: ComplyCall Id: [id number]

## 2017-02-25 ENCOUNTER — Ambulatory Visit (INDEPENDENT_AMBULATORY_CARE_PROVIDER_SITE_OTHER): Payer: BLUE CROSS/BLUE SHIELD | Admitting: Family Medicine

## 2017-02-25 VITALS — BP 100/64 | HR 72 | Temp 98.5°F | Ht 65.0 in | Wt 117.5 lb

## 2017-02-25 DIAGNOSIS — R8290 Unspecified abnormal findings in urine: Secondary | ICD-10-CM | POA: Diagnosis not present

## 2017-02-25 DIAGNOSIS — R1031 Right lower quadrant pain: Secondary | ICD-10-CM | POA: Diagnosis not present

## 2017-02-25 LAB — POC URINALSYSI DIPSTICK (AUTOMATED)
Bilirubin, UA: NEGATIVE
GLUCOSE UA: NEGATIVE
NITRITE UA: NEGATIVE
PH UA: 6 (ref 5.0–8.0)
PROTEIN UA: NEGATIVE
RBC UA: NEGATIVE
Spec Grav, UA: >=1.03 — AB (ref 1.010–1.025)
UROBILINOGEN UA: 0.2 U/dL

## 2017-02-25 NOTE — Addendum Note (Signed)
Addended by: Mady Haagensen on: 02/25/2017 02:42 PM   Modules accepted: Orders

## 2017-02-25 NOTE — Assessment & Plan Note (Signed)
New- resolved. Exam and history reassuring. ?part of her IBS symptoms- improved with antispasmodic and NSAIDs. Call or return to clinic prn if these symptoms worsen or fail to improve as anticipated. The patient indicates understanding of these issues and agrees with the plan.

## 2017-02-25 NOTE — Progress Notes (Signed)
Pre visit review using our clinic review tool, if applicable. No additional management support is needed unless otherwise documented below in the visit note. 

## 2017-02-25 NOTE — Progress Notes (Signed)
Subjective:   Patient ID: [name], female    DOB: 1975/10/04, 41 y.o.   MRN: [id number]  [name] is a pleasant 41 y.o. year old female who presents to clinic today with Abdominal Pain  on 02/25/2017  HPI:  Has had issues with abdominal pain and unintentional weight loss, followed by Dr, Collene Mares for well over a year. Weight is stable since I last saw her in 10/2016.  Wt Readings from Last 3 Encounters:  02/25/17 117 lb 8 oz (53.3 kg)  11/14/16 117 lb 4 oz (53.2 kg)  02/24/16 129 lb (58.5 kg)     States she is afraid to eat but shortly after eating anything, she has abdominal cramping and loose stools.  Stools often have mucous, no blood. No nausea or vomiting. No early satiety.  Certain foods do not seem to make it worse.  She is still taking a probiotic.  Here today because she has had intermittent RLQ abdominal pain 3-4/10 for 3 days.  No nausea or vomiting.  And no new changes in her bowel habits.  Pain has already almost completely resolved.  Did take one ibuprofen and bentyl prescribed by Dr. Collene Mares.  CT of abdomen/pelvis last done on 06/05/16-  CLINICAL DATA:  Diffuse abdominal pain  EXAM: CT ABDOMEN AND PELVIS WITH CONTRAST  TECHNIQUE: Multidetector CT imaging of the abdomen and pelvis was performed using the standard protocol following bolus administration of intravenous contrast.  CONTRAST:  137mL ISOVUE-300 IOPAMIDOL (ISOVUE-300) INJECTION 61%  COMPARISON:  None.  FINDINGS: Lower chest: Lung bases are clear. No effusions. Heart is normal size.  Hepatobiliary: 11 mm low-density lesion in the right hepatic lobe, likely cysts. Gallbladder unremarkable.  Pancreas: No focal abnormality or ductal dilatation.  Spleen: No focal abnormality.  Normal size.  Adrenals/Urinary Tract: No adrenal abnormality. No focal renal abnormality. No stones or hydronephrosis. Urinary bladder is unremarkable.  Stomach/Bowel: Appendix is normal. Stomach, large and  small bowel grossly unremarkable.  Vascular/Lymphatic: No evidence of aneurysm or adenopathy.  Reproductive: Probable small fibroid within the fundus of the uterus. No adnexal masses.  Other: No free fluid or free air.  Musculoskeletal: No acute bony abnormality or focal bone lesion.  IMPRESSION: No acute findings in the abdomen or pelvis.  Normal appendix.  Probable small uterine fibroid.   Electronically Signed   By: Rolm Baptise M.D.   On: 06/05/2016 12:21 Current Outpatient Prescriptions on File Prior to Visit  Medication Sig Dispense Refill  . levothyroxine (SYNTHROID, LEVOTHROID) 25 MCG tablet Take 1 tablet (25 mcg total) by mouth daily before breakfast. 90 tablet 2  . levothyroxine (SYNTHROID, LEVOTHROID) 25 MCG tablet TAKE 1 TABLET (25 MCG TOTAL) BY MOUTH DAILY BEFORE BREAKFAST. 90 tablet 3  . Probiotic Product (RESTORA PO) Take 1 capsule by mouth daily.    . Vitamin D, Ergocalciferol, (DRISDOL) 50000 units CAPS capsule TAKE 1 CAPSULE (50,000 UNITS TOTAL) BY MOUTH EVERY 7 (SEVEN) DAYS. 12 capsule 0   No current facility-administered medications on file prior to visit.     No Known Allergies  Past Medical History:  Diagnosis Date  . Endometriosis   . Herniated disc     No past surgical history on file.  Family History  Problem Relation Age of Onset  . Depression Other   . Thyroid disease Other   . Thyroid disease Mother   . Diabetes Father     Social History   Social History  . Marital status: Married    Spouse name:  N/A  . Number of children: 1  . Years of education: N/A   Occupational History  .  [employer]   Social History Main Topics  . Smoking status: Never Smoker  . Smokeless tobacco: Never Used  . Alcohol use No  . Drug use: No  . Sexual activity: Not on file   Other Topics Concern  . Not on file   Social History Narrative  . No narrative on file   The PMH, PSH, Social History, Family History, Medications, and  allergies have been reviewed in [facility], and have been updated if relevant.   Review of Systems  Constitutional: Negative.  Negative for fever and unexpected weight change.  Gastrointestinal: Positive for abdominal distention and abdominal pain. Negative for anal bleeding, blood in stool, constipation, diarrhea, nausea, rectal pain and vomiting.  All other systems reviewed and are negative.      Objective:    BP 100/64   Pulse 72   Temp 98.5 F (36.9 C)   Ht 5\' 5"  (1.651 m)   Wt 117 lb 8 oz (53.3 kg)   LMP 02/05/2017   SpO2 100%   BMI 19.55 kg/m    Physical Exam   General:  Well-developed,well-nourished,in no acute distress; alert,appropriate and cooperative throughout examination Head:  normocephalic and atraumatic.   Eyes:  vision grossly intact, PERRL Ears:  R ear normal and L ear normal externally, TMs clear bilaterally Nose:  no external deformity.   Mouth:  good dentition.   Neck:  No deformities, masses, or tenderness noted. Lungs:  Normal respiratory effort, chest expands symmetrically. Lungs are clear to auscultation, no crackles or wheezes. Heart:  Normal rate and regular rhythm. S1 and S2 normal without gallop, murmur, click, rub or other extra sounds. Abdomen:  Bowel sounds positive,abdomen soft and non-tender without masses, organomegaly or hernias noted. Neurologic:  alert & oriented X3 and gait normal.   Skin:  Intact without suspicious lesions or rashes Psych:  Cognition and judgment appear intact. Alert and cooperative with normal attention span and concentration. No apparent delusions, illusions, hallucinations       Assessment & Plan:   Right lower quadrant pain No Follow-up on file.

## 2017-02-26 ENCOUNTER — Telehealth: Payer: Self-pay

## 2017-02-26 LAB — URINE CULTURE

## 2017-02-26 NOTE — Telephone Encounter (Signed)
Pt left note wanting to know if can take motrin and tylenol with Bentyl.Please advise.

## 2017-02-27 NOTE — Telephone Encounter (Signed)
Yes ok to take both

## 2017-02-27 NOTE — Telephone Encounter (Signed)
Pt's husband was notified.  

## 2017-03-04 ENCOUNTER — Ambulatory Visit: Payer: Self-pay | Admitting: Family Medicine

## 2017-04-22 ENCOUNTER — Ambulatory Visit (INDEPENDENT_AMBULATORY_CARE_PROVIDER_SITE_OTHER): Payer: BLUE CROSS/BLUE SHIELD | Admitting: Obstetrics & Gynecology

## 2017-04-22 ENCOUNTER — Encounter: Payer: Self-pay | Admitting: Obstetrics & Gynecology

## 2017-04-22 VITALS — BP 114/70 | Ht 65.0 in | Wt 116.0 lb

## 2017-04-22 DIAGNOSIS — R102 Pelvic and perineal pain: Secondary | ICD-10-CM

## 2017-04-22 DIAGNOSIS — N921 Excessive and frequent menstruation with irregular cycle: Secondary | ICD-10-CM | POA: Diagnosis not present

## 2017-04-22 MED ORDER — NORETHIN ACE-ETH ESTRAD-FE 1-20 MG-MCG(24) PO TABS
1.0000 | ORAL_TABLET | Freq: Every day | ORAL | 4 refills | Status: DC
Start: 2017-04-22 — End: 2017-05-17

## 2017-04-22 NOTE — Progress Notes (Signed)
    [name] 23-Nov-1975 [id number]        41 y.o.  G1P1 Married  RP:  Low abdominal pain with Metrorrhagia x several months  HPI:  Menses regular normal every month.  D/ced BCPs, was on it because of a h/o Endometriosis.  Has a Liver hemangioma.  C/O low midline abdominal discomfort with cramps x several months.  Also has spotting in between periods.  Pelvic pain not limited to periods or episodes of BTB.  No abnormal vaginal d/c.  Rarely sexually active, no recent IC, long h/o pain with IC/vaginismus.  No UTI Sx, BMs wnl.  Past medical history,surgical history, problem list, medications, allergies, family history and social history were all reviewed and documented in the EPIC chart.  Directed ROS with pertinent positives and negatives documented in the history of present illness/assessment and plan.  Exam:  Vitals:   04/22/17 1526  BP: 114/70  Weight: 116 lb (52.6 kg)  Height: 5\' 5"  (1.651 m)   General appearance:  Normal  Gyn exam:  Vulva normal.  Bimalual exam:  Uterus AV normal size, NT.  No adnexal mass, NT.  UPT neg  Assessment/Plan:  41 y.o. G1P1   1. Female pelvic pain Midline cramping/pressure pelvic pain.  H/O Endometriosis.  UPT neg.  F/U Pelvic US.  Will consider Progestin contraception per US findings. - US Transvaginal Non-OB; Future  2. Metrorrhagia Spotting between periods.  Will evaluate Endometrial Line, r/o Polyps/myomas. - US Transvaginal Non-OB; Future  Counseling on above issues >50% x 15 minutes.  Princess Bruins MD, 3:50 PM 04/22/2017

## 2017-04-28 NOTE — Patient Instructions (Signed)
1. Female pelvic pain Midline cramping/pressure pelvic pain.  H/O Endometriosis.  UPT neg.  F/U Pelvic US.  Will consider Progestin contraception per US findings. - US Transvaginal Non-OB; Future  2. Metrorrhagia Spotting between periods.  Will evaluate Endometrial Line, r/o Polyps/myomas. - US Transvaginal Non-OB; Future.  Cambrea, it was a pleasure to see you today!  See you again soon.

## 2017-05-07 ENCOUNTER — Other Ambulatory Visit: Payer: Self-pay | Admitting: Family Medicine

## 2017-05-09 NOTE — Telephone Encounter (Signed)
Rx request for Vitamin D 50000  Last OV 02/25/17 Last Labs 11/08/2016 Last refilled 02/07/2017 Next OV none

## 2017-05-15 ENCOUNTER — Ambulatory Visit (INDEPENDENT_AMBULATORY_CARE_PROVIDER_SITE_OTHER): Payer: BLUE CROSS/BLUE SHIELD

## 2017-05-15 ENCOUNTER — Ambulatory Visit (INDEPENDENT_AMBULATORY_CARE_PROVIDER_SITE_OTHER): Payer: BLUE CROSS/BLUE SHIELD | Admitting: Obstetrics & Gynecology

## 2017-05-15 DIAGNOSIS — N945 Secondary dysmenorrhea: Secondary | ICD-10-CM

## 2017-05-15 DIAGNOSIS — N803 Endometriosis of pelvic peritoneum: Secondary | ICD-10-CM | POA: Diagnosis not present

## 2017-05-15 DIAGNOSIS — R102 Pelvic and perineal pain: Secondary | ICD-10-CM

## 2017-05-15 DIAGNOSIS — N921 Excessive and frequent menstruation with irregular cycle: Secondary | ICD-10-CM

## 2017-05-15 DIAGNOSIS — IMO0002 Reserved for concepts with insufficient information to code with codable children: Secondary | ICD-10-CM

## 2017-05-15 NOTE — Progress Notes (Signed)
    Trachelle Frazier 1976/06/01 [id number]        41 y.o.  G1P1 Married  RP:  Midline pelvic pain/Dysmenorrhea/Metrorrhagia for pelvic US  HPI:  Longstanding pelvic pain and Dysmenorrhea as well as Dyspareunia/Vaginismus.  Previous Dx of Pelvic Endometriosis, was on BCPs to control Sxs, but had to stop because of a Liver Hemangioma.  Off of BCPs, C/O Frazier midline abdominal discomfort with cramps x several months.  Also has spotting in between periods. Pelvic pain not limited to periods or episodes of BTB.  Past medical history,surgical history, problem list, medications, allergies, family history and social history were all reviewed and documented in the EPIC chart.  Directed ROS with pertinent positives and negatives documented in the history of present illness/assessment and plan.  Exam:  There were no vitals filed for this visit. General appearance:  Normal  Pelvic US today: T/V Anteverted Uterus 8.52 x 6.03 x 4.78 cm with Tri-layered Endometrial line at 9.7 mm.  Heterogeneous Myometrium with numerous cortical cystic areas c/w Adenomyosis.  Right Ovary normal with echo-free follicle 2.3 x 1.9 cm.  Left Ovary normal.  No FF in CDS.    Assessment/Plan:  41 y.o. G1P1  1. Pelvic pain in female Probable Adenomyosis.  Recommend starting on Progestin-pill to control cycle/Sx of Adenomyosis and Endometriosis.  Avoiding Estrogen because of her Liver Hemangioma.  If not sufficient, will consider Nexplanon or DepoProvera.  2. Secondary dysmenorrhea As above.  Probable Adenomyosis.  Recommend starting on Progestin-pill.  3. H/O Endometriosis of pelvis  Counseling on above issues >50% x 15 minutes.  Princess Bruins MD, 5:13 PM 05/15/2017

## 2017-05-17 MED ORDER — NORETHINDRONE 0.35 MG PO TABS: 1.0000 | ORAL_TABLET | Freq: Every day | ORAL | 4 refills | Status: DC

## 2017-05-17 NOTE — Patient Instructions (Signed)
1. Pelvic pain in female Probable Adenomyosis.  Recommend starting on Progestin-pill to control cycle/Sx of Adenomyosis and Endometriosis.  Avoiding Estrogen because of her Liver Hemangioma.  If not sufficient, will consider Nexplanon or DepoProvera.  2. Secondary dysmenorrhea As above.  Probable Adenomyosis.  Recommend starting on Progestin-pill.  3. H/O Endometriosis of pelvis  [name], it was good to see you today!    Dysmenorrhea Menstrual cramps (dysmenorrhea) are caused by the muscles of the uterus tightening (contracting) during a menstrual period. For some women, this discomfort is merely bothersome. For others, dysmenorrhea can be severe enough to interfere with everyday activities for a few days each month. Primary dysmenorrhea is menstrual cramps that last a couple of days when you start having menstrual periods or soon after. This often begins after a teenager starts having her period. As a woman gets older or has a baby, the cramps will usually lessen or disappear. Secondary dysmenorrhea begins later in life, lasts longer, and the pain may be stronger than primary dysmenorrhea. The pain may start before the period and last a few days after the period. What are the causes? Dysmenorrhea is usually caused by an underlying problem, such as:  The tissue lining the uterus grows outside of the uterus in other areas of the body (endometriosis).  The endometrial tissue, which normally lines the uterus, is found in or grows into the muscular walls of the uterus (adenomyosis).  The pelvic blood vessels are engorged with blood just before the menstrual period (pelvic congestive syndrome).  Overgrowth of cells (polyps) in the lining of the uterus or cervix.  Falling down of the uterus (prolapse) because of loose or stretched ligaments.  Depression.  Bladder problems, infection, or inflammation.  Problems with the intestine, a tumor, or irritable bowel syndrome.  Cancer of the female  organs or bladder.  A severely tipped uterus.  A very tight opening or closed cervix.  Noncancerous tumors of the uterus (fibroids).  Pelvic inflammatory disease (PID).  Pelvic scarring (adhesions) from a previous surgery.  Ovarian cyst.  An intrauterine device (IUD) used for birth control.  What increases the risk? You may be at greater risk of dysmenorrhea if:  You are younger than age 85.  You started puberty early.  You have irregular or heavy bleeding.  You have never given birth.  You have a family history of this problem.  You are a smoker.  What are the signs or symptoms?  Cramping or throbbing pain in your lower abdomen.  Headaches.  Lower back pain.  Nausea or vomiting.  Diarrhea.  Sweating or dizziness.  Loose stools. How is this diagnosed? A diagnosis is based on your history, symptoms, physical exam, diagnostic tests, or procedures. Diagnostic tests or procedures may include:  Blood tests.  Ultrasonography.  An examination of the lining of the uterus (dilation and curettage, D&C).  An examination inside your abdomen or pelvis with a scope (laparoscopy).  X-rays.  CT scan.  MRI.  An examination inside the bladder with a scope (cystoscopy).  An examination inside the intestine or stomach with a scope (colonoscopy, gastroscopy).  How is this treated? Treatment depends on the cause of the dysmenorrhea. Treatment may include:  Pain medicine prescribed by your health care provider.  Birth control pills or an IUD with progesterone hormone in it.  Hormone replacement therapy.  Nonsteroidal anti-inflammatory drugs (NSAIDs). These may help stop the production of prostaglandins.  Surgery to remove adhesions, endometriosis, ovarian cyst, or fibroids.  Removal of the  uterus (hysterectomy).  Progesterone shots to stop the menstrual period.  Cutting the nerves on the sacrum that go to the female organs (presacral  neurectomy).  Electric current to the sacral nerves (sacral nerve stimulation).  Antidepressant medicine.  Psychiatric therapy, counseling, or group therapy.  Exercise and physical therapy.  Meditation and yoga therapy.  Acupuncture.  Follow these instructions at home:  Only take over-the-counter or prescription medicines as directed by your health care provider.  Place a heating pad or hot water bottle on your lower back or abdomen. Do not sleep with the heating pad.  Use aerobic exercises, walking, swimming, biking, and other exercises to help lessen the cramping.  Massage to the lower back or abdomen may help.  Stop smoking.  Avoid alcohol and caffeine. Contact a health care provider if:  Your pain does not get better with medicine.  You have pain with sexual intercourse.  Your pain increases and is not controlled with medicines.  You have abnormal vaginal bleeding with your period.  You develop nausea or vomiting with your period that is not controlled with medicine. Get help right away if: You pass out. This information is not intended to replace advice given to you by your health care provider. Make sure you discuss any questions you have with your health care provider. Document Released: 09/03/2005 Document Revised: 02/09/2016 Document Reviewed: 02/19/2013 Elsevier Interactive Patient Education  2017 [employer].

## 2017-05-21 ENCOUNTER — Telehealth: Payer: Self-pay | Admitting: *Deleted

## 2017-05-21 NOTE — Telephone Encounter (Signed)
Left message for pt to call.

## 2017-05-21 NOTE — Telephone Encounter (Signed)
Pt informed

## 2017-05-21 NOTE — Telephone Encounter (Signed)
-----   Message from Princess Bruins, MD sent at 05/17/2017  9:54 AM EDT ----- Regarding: Change from [place] to Progestin only pill Please call patient for her to switch to Progestin only pill (she may not have started the [place] yet...).  I prefer Progestin only pill for her because of her Liver Hemangioma.

## 2017-07-10 ENCOUNTER — Other Ambulatory Visit: Payer: Self-pay | Admitting: Obstetrics & Gynecology

## 2017-07-10 DIAGNOSIS — Z1231 Encounter for screening mammogram for malignant neoplasm of breast: Secondary | ICD-10-CM

## 2017-07-11 ENCOUNTER — Telehealth: Payer: Self-pay | Admitting: *Deleted

## 2017-07-11 NOTE — Telephone Encounter (Signed)
Pt called c/o spotting x 8 days now, started on new pill camilla 0.35 mg, started on 06/24/17 has spotting then period flow x 2 days (not heavy) and spotting again. Still has right side discomfort as well, stopped pills x 2 day due to spotting. I explained irregular spotting/bleeding not abnormal when prescribed a new pill.  Pt wanted to you know you thoughts? Please advise

## 2017-07-13 NOTE — Telephone Encounter (Signed)
Recommend to continue with Progestin Pill x 3 packs and if not better then, to schedule a visit with me to discuss alternatives.  Spotting at this point is not concerning.  If heavier bleeding, can schedule with me before the end of the 3 packs.

## 2017-07-15 NOTE — Telephone Encounter (Signed)
Pt informed with the below note. 

## 2017-07-31 ENCOUNTER — Ambulatory Visit: Payer: BLUE CROSS/BLUE SHIELD

## 2017-08-28 ENCOUNTER — Ambulatory Visit
Admission: RE | Admit: 2017-08-28 | Discharge: 2017-08-28 | Disposition: A | Discharge status: Home / Self Care | Payer: BLUE CROSS/BLUE SHIELD | Source: Ambulatory Visit | Attending: Obstetrics & Gynecology | Admitting: Obstetrics & Gynecology

## 2017-08-28 DIAGNOSIS — Z1231 Encounter for screening mammogram for malignant neoplasm of breast: Secondary | ICD-10-CM

## 2017-09-05 ENCOUNTER — Ambulatory Visit (INDEPENDENT_AMBULATORY_CARE_PROVIDER_SITE_OTHER): Payer: BLUE CROSS/BLUE SHIELD

## 2017-09-05 DIAGNOSIS — Z23 Encounter for immunization: Secondary | ICD-10-CM | POA: Diagnosis not present

## 2017-12-10 ENCOUNTER — Ambulatory Visit: Payer: BLUE CROSS/BLUE SHIELD | Admitting: Primary Care

## 2017-12-10 ENCOUNTER — Encounter: Payer: Self-pay | Admitting: Family Medicine

## 2018-01-11 ENCOUNTER — Other Ambulatory Visit: Payer: Self-pay | Admitting: Family Medicine

## 2018-02-11 ENCOUNTER — Telehealth: Payer: Self-pay

## 2018-02-11 NOTE — Telephone Encounter (Signed)
Copied from [place] [phone]. Topic: General - Other >> Feb 11, 2018  1:55 PM [name] L wrote: Reason for CRM: Patient wants to know if she can do fasting labs around 8am on 06/20 when she comes in for her CPE with Baity? Advised her someone will call her back to notify her if we can schedule them in advance before her 3:15pm CPE.

## 2018-02-11 NOTE — Telephone Encounter (Signed)
I spoke with pt and advised can have early lunch around 11 AM and then 4 hrs prior to CPX on 03/06/18 at 3:15 pt could be fasting and then after seeing R Baity NP pt can go by lab and have labs drawn at that time. Pt voiced understanding and nothing further needed.

## 2018-03-06 ENCOUNTER — Encounter: Payer: Self-pay | Admitting: Internal Medicine

## 2018-03-06 ENCOUNTER — Ambulatory Visit (INDEPENDENT_AMBULATORY_CARE_PROVIDER_SITE_OTHER): Payer: BLUE CROSS/BLUE SHIELD | Admitting: Internal Medicine

## 2018-03-06 VITALS — BP 106/68 | HR 67 | Temp 98.0°F | Ht 65.5 in | Wt 120.0 lb

## 2018-03-06 DIAGNOSIS — Z13220 Encounter for screening for lipoid disorders: Secondary | ICD-10-CM | POA: Diagnosis not present

## 2018-03-06 DIAGNOSIS — N809 Endometriosis, unspecified: Secondary | ICD-10-CM | POA: Diagnosis not present

## 2018-03-06 DIAGNOSIS — K580 Irritable bowel syndrome with diarrhea: Secondary | ICD-10-CM

## 2018-03-06 DIAGNOSIS — E039 Hypothyroidism, unspecified: Secondary | ICD-10-CM

## 2018-03-06 DIAGNOSIS — K589 Irritable bowel syndrome without diarrhea: Secondary | ICD-10-CM | POA: Insufficient documentation

## 2018-03-06 DIAGNOSIS — E559 Vitamin D deficiency, unspecified: Secondary | ICD-10-CM

## 2018-03-06 NOTE — Progress Notes (Signed)
HPI  Pt presents to the clinic today to establish care and for management of the conditions listed below. [name] is transferring care from Dr. Deborra Medina.  Hypothyroidism: [name] denies any issues on her current dose of Levothyroxine. [name] is due to have her labs repeated today.  IBS: Mainly diarrhea and abdominal cramping. [name] no longer takes Bentyl. Shee had a normal colonoscopy by Dr. Collene Mares last year.   Endometriosis: [name] no longer follows with GYN.  Flu: 08/2017 Tetanus: 03/2011 Pap Smear: 09/2015 Mammogram: 08/2017 Vision Screening: annually Dentist: biannually   Past Medical History:  Diagnosis Date  . Endometriosis   . Herniated disc     Current Outpatient Medications  Medication Sig Dispense Refill  . dicyclomine (BENTYL) 20 MG tablet Take 20 mg by mouth 2 (two) times daily.  0  . levothyroxine (SYNTHROID, LEVOTHROID) 25 MCG tablet TAKE 1 TABLET (25 MCG TOTAL) BY MOUTH DAILY BEFORE BREAKFAST. 90 tablet 0  . Multiple Vitamin (MULTIVITAMIN) tablet Take 1 tablet by mouth daily.    . norethindrone (MICRONOR,CAMILA,ERRIN) 0.35 MG tablet Take 1 tablet (0.35 mg total) by mouth daily. 3 Package 4  . Probiotic Product (RESTORA PO) Take 1 capsule by mouth daily.    . Vitamin D, Ergocalciferol, (DRISDOL) 50000 units CAPS capsule TAKE 1 CAPSULE (50,000 UNITS TOTAL) BY MOUTH EVERY 7 (SEVEN) DAYS. 12 capsule 0   No current facility-administered medications for this visit.     No Known Allergies  Family History  Problem Relation Age of Onset  . Depression Other   . Thyroid disease Other   . Thyroid disease Mother   . Diabetes Father     Social History   Socioeconomic History  . Marital status: Married    Spouse name: Not on file  . Number of children: 1  . Years of education: Not on file  . Highest education level: Not on file  Occupational History    Employer: [employer]  . Financial resource strain: Not on file  . Food insecurity:    Worry: Not on file   Inability: Not on file  . Transportation needs:    Medical: Not on file    Non-medical: Not on file  Tobacco Use  . Smoking status: Never Smoker  . Smokeless tobacco: Never Used  Substance and Sexual Activity  . Alcohol use: No    Alcohol/week: 0.0 oz  . Drug use: No  . Sexual activity: Yes    Partners: Male    Comment: 1st intercourse- 43 partners-  89, married- 75 yrs   Lifestyle  . Physical activity:    Days per week: Not on file    Minutes per session: Not on file  . Stress: Not on file  Relationships  . Social connections:    Talks on phone: Not on file    Gets together: Not on file    Attends religious service: Not on file    Active member of club or organization: Not on file    Attends meetings of clubs or organizations: Not on file    Relationship status: Not on file  . Intimate partner violence:    Fear of current or ex partner: Not on file    Emotionally abused: Not on file    Physically abused: Not on file    Forced sexual activity: Not on file  Other Topics Concern  . Not on file  Social History Narrative  . Not on file    ROS:  Constitutional: Denies  fever, malaise, fatigue, headache or abrupt weight changes.  HEENT: Denies eye pain, eye redness, ear pain, ringing in the ears, wax buildup, runny nose, nasal congestion, bloody nose, or sore throat. Respiratory: Denies difficulty breathing, shortness of breath, cough or sputum production.   Cardiovascular: Denies chest pain, chest tightness, palpitations or swelling in the hands or feet.  Gastrointestinal: Pt reports abdominal pain, bloating and diarrhea. Denies constipation, or blood in the stool.  GU: Denies frequency, urgency, pain with urination, blood in urine, odor or discharge. Musculoskeletal: Denies decrease in range of motion, difficulty with gait, muscle pain or joint pain and swelling.  Skin: Denies redness, rashes, lesions or ulcercations.  Neurological: Denies dizziness, difficulty with memory,  difficulty with speech or problems with balance and coordination.  Psych: Denies anxiety, depression, SI/HI.  No other specific complaints in a complete review of systems (except as listed in HPI above).  PE:  BP 106/68   Pulse 67   Temp 98 F (36.7 C) (Oral)   Ht 5' 5.5" (1.664 m)   Wt 120 lb (54.4 kg)   LMP 03/05/2018   SpO2 97%   BMI 19.67 kg/m   Wt Readings from Last 3 Encounters:  04/22/17 116 lb (52.6 kg)  02/25/17 117 lb 8 oz (53.3 kg)  11/14/16 117 lb 4 oz (53.2 kg)    General: Appears her stated age, well developed, well nourished in NAD. HNeck: Neck supple, trachea midline. No masses, lumps present.  Cardiovascular: Normal rate and rhythm. S1,S2 noted.  No murmur, rubs or gallops noted. No JVD or BLE edema. No carotid bruits noted. Pulmonary/Chest: Normal effort and positive vesicular breath sounds. No respiratory distress. No wheezes, rales or ronchi noted.  Abdomen: Soft and tender in the epigastric region. Normal bowel sounds. No distention or masses noted.  Neurological: Alert and oriented.  Psychiatric: Mood and affect normal. Behavior is normal. Judgment and thought content normal.     BMET    Component Value Date/Time   NA 135 11/08/2016 1318   K 4.1 11/08/2016 1318   CL 105 11/08/2016 1318   CO2 28 11/08/2016 1318   GLUCOSE 90 11/08/2016 1318   BUN 11 11/08/2016 1318   CREATININE 0.73 11/08/2016 1318   CALCIUM 9.2 11/08/2016 1318    Lipid Panel     Component Value Date/Time   CHOL 115 11/08/2016 1318   TRIG 31.0 11/08/2016 1318   HDL 45.90 11/08/2016 1318   CHOLHDL 3 11/08/2016 1318   VLDL 6.2 11/08/2016 1318   LDLCALC 63 11/08/2016 1318    CBC    Component Value Date/Time   WBC 3.6 (L) 11/08/2016 1318   RBC 4.47 11/08/2016 1318   HGB 12.1 11/08/2016 1318   HCT 37.9 11/08/2016 1318   PLT 220.0 11/08/2016 1318   MCV 84.9 11/08/2016 1318   MCHC 31.9 11/08/2016 1318   RDW 14.0 11/08/2016 1318   LYMPHSABS 1.5 11/08/2016 1318    MONOABS 0.3 11/08/2016 1318   EOSABS 0.1 11/08/2016 1318   BASOSABS 0.0 11/08/2016 1318    Hgb A1C No results found for: HGBA1C   Assessment and Plan:

## 2018-03-06 NOTE — Assessment & Plan Note (Signed)
TSH and Free T4 today Will adjust Levothyroxine if needed based on labs 

## 2018-03-06 NOTE — Assessment & Plan Note (Signed)
She will continue to follow with gyn

## 2018-03-06 NOTE — Assessment & Plan Note (Signed)
She will continue to follow with GI 

## 2018-03-06 NOTE — Patient Instructions (Signed)
Diet for Irritable Bowel Syndrome When you have irritable bowel syndrome (IBS), the foods you eat and your eating habits are very important. IBS may cause various symptoms, such as abdominal pain, constipation, or diarrhea. Choosing the right foods can help ease discomfort caused by these symptoms. Work with your health care provider and dietitian to find the best eating plan to help control your symptoms. What general guidelines do I need to follow?  Keep a food diary. This will help you identify foods that cause symptoms. Write down: ? What you eat and when. ? What symptoms you have. ? When symptoms occur in relation to your meals.  Avoid foods that cause symptoms. Talk with your dietitian about other ways to get the same nutrients that are in these foods.  Eat more foods that contain fiber. Take a fiber supplement if directed by your dietitian.  Eat your meals slowly, in a relaxed setting.  Aim to eat 5-6 small meals per day. Do not skip meals.  Drink enough fluids to keep your urine clear or pale yellow.  Ask your health care provider if you should take an over-the-counter probiotic during flare-ups to help restore healthy gut bacteria.  If you have cramping or diarrhea, try making your meals low in fat and high in carbohydrates. Examples of carbohydrates are pasta, rice, whole grain breads and cereals, fruits, and vegetables.  If dairy products cause your symptoms to flare up, try eating less of them. You might be able to handle yogurt better than other dairy products because it contains bacteria that help with digestion. What foods are not recommended? The following are some foods and drinks that may worsen your symptoms:  Fatty foods, such as French fries.  Milk products, such as cheese or ice cream.  Chocolate.  Alcohol.  Products with caffeine, such as coffee.  Carbonated drinks, such as soda.  The items listed above may not be a complete list of foods and beverages to  avoid. Contact your dietitian for more information. What foods are good sources of fiber? Your health care provider or dietitian may recommend that you eat more foods that contain fiber. Fiber can help reduce constipation and other IBS symptoms. Add foods with fiber to your diet a little at a time so that your body can get used to them. Too much fiber at once might cause gas and swelling of your abdomen. The following are some foods that are good sources of fiber:  Apples.  Peaches.  Pears.  Berries.  Figs.  Broccoli (raw).  Cabbage.  Carrots.  Raw peas.  Kidney beans.  Lima beans.  Whole grain bread.  Whole grain cereal.  Where to find more information: International Foundation for Functional Gastrointestinal Disorders: www.iffgd.org National Institute of Diabetes and Digestive and Kidney Diseases: www.niddk.nih.gov/health-information/health-topics/digestive-diseases/ibs/Pages/facts.aspx This information is not intended to replace advice given to you by your health care provider. Make sure you discuss any questions you have with your health care provider. Document Released: 11/24/2003 Document Revised: 02/09/2016 Document Reviewed: 12/04/2013 Elsevier Interactive Patient Education  2018 [employer]  

## 2018-03-07 LAB — COMPREHENSIVE METABOLIC PANEL
ALK PHOS: 40 U/L (ref 39–117)
ALT: 11 U/L (ref 0–35)
AST: 15 U/L (ref 0–37)
Albumin: 4.5 g/dL (ref 3.5–5.2)
BUN: 9 mg/dL (ref 6–23)
CO2: 30 meq/L (ref 19–32)
Calcium: 9.6 mg/dL (ref 8.4–10.5)
Chloride: 101 mEq/L (ref 96–112)
Creatinine, Ser: 0.66 mg/dL (ref 0.40–1.20)
GFR: 104.58 mL/min (ref >60.00)
GLUCOSE: 85 mg/dL (ref 70–99)
Potassium: 3.8 mEq/L (ref 3.5–5.1)
Sodium: 138 mEq/L (ref 135–145)
TOTAL PROTEIN: 7.6 g/dL (ref 6.0–8.3)
Total Bilirubin: 1 mg/dL (ref 0.2–1.2)

## 2018-03-07 LAB — LIPID PANEL
CHOLESTEROL: 130 mg/dL (ref 0–200)
HDL: 59.9 mg/dL (ref >39.00)
LDL CALC: 64 mg/dL (ref 0–99)
NonHDL: 70.26
Total CHOL/HDL Ratio: 2
Triglycerides: 33 mg/dL (ref 0.0–149.0)
VLDL: 6.6 mg/dL (ref 0.0–40.0)

## 2018-03-07 LAB — CBC
HEMATOCRIT: 37.7 % (ref 36.0–46.0)
Hemoglobin: 12.1 g/dL (ref 12.0–15.0)
MCHC: 32.2 g/dL (ref 30.0–36.0)
MCV: 84.2 fl (ref 78.0–100.0)
Platelets: 219 10*3/uL (ref 150.0–400.0)
RBC: 4.48 Mil/uL (ref 3.87–5.11)
RDW: 14.1 % (ref 11.5–15.5)
WBC: 3.9 10*3/uL — AB (ref 4.0–10.5)

## 2018-03-07 LAB — T4, FREE: Free T4: 0.99 ng/dL (ref 0.60–1.60)

## 2018-03-07 LAB — VITAMIN D 25 HYDROXY (VIT D DEFICIENCY, FRACTURES): VITD: 16.95 ng/mL — AB (ref 30.00–100.00)

## 2018-03-07 LAB — TSH: TSH: 4.3 u[IU]/mL (ref 0.35–4.50)

## 2018-03-10 IMAGING — MG DIGITAL SCREENING BILATERAL MAMMOGRAM WITH CAD
4 series · 4 of 4 positions shown · non-contrast
Comparison: None.

CLINICAL DATA: Screening.

EXAM:
DIGITAL SCREENING BILATERAL MAMMOGRAM WITH CAD

[R MLO]
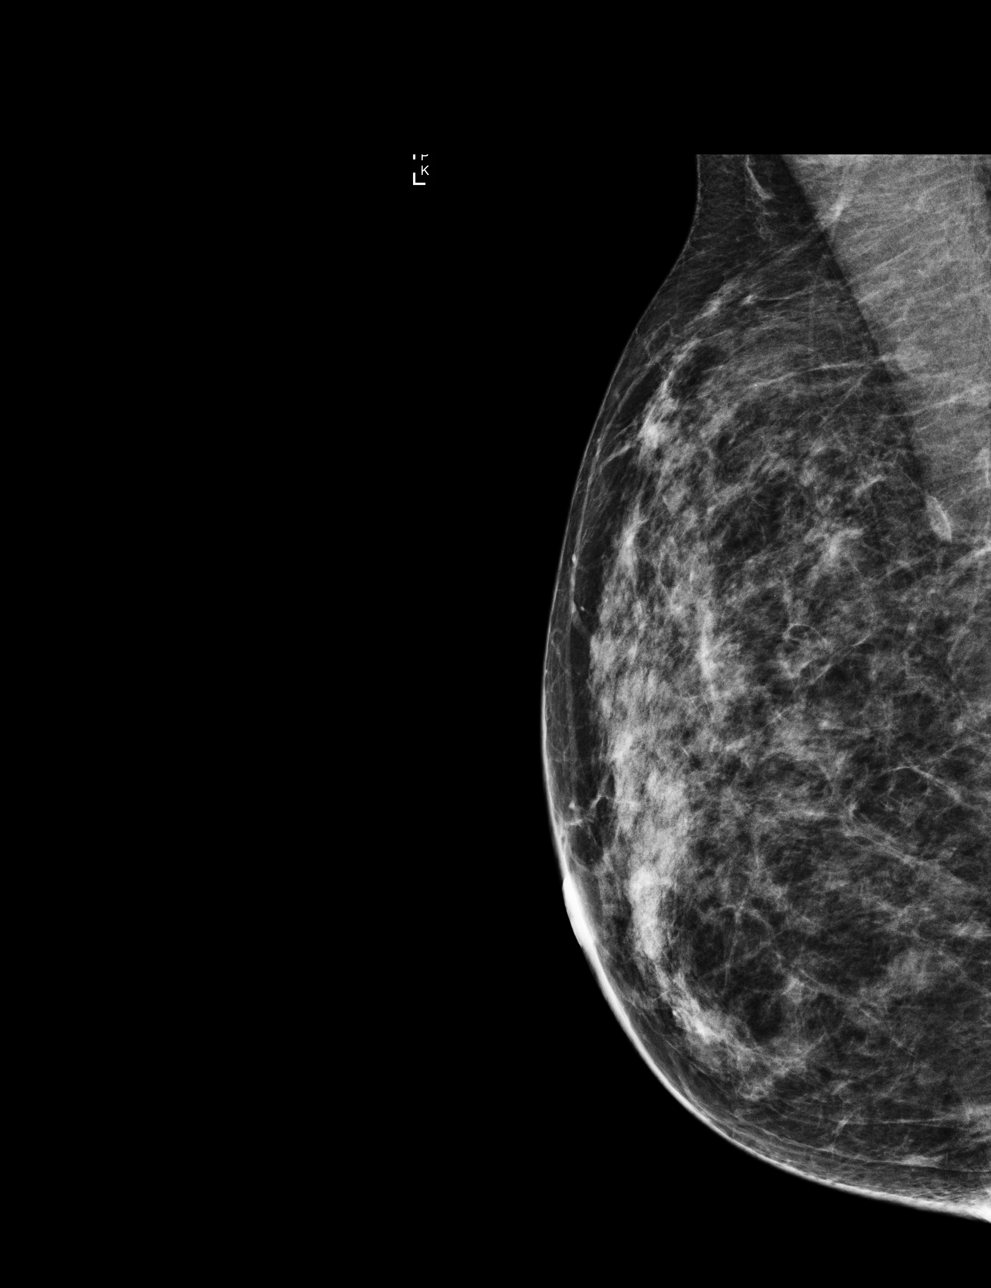

[L CC]
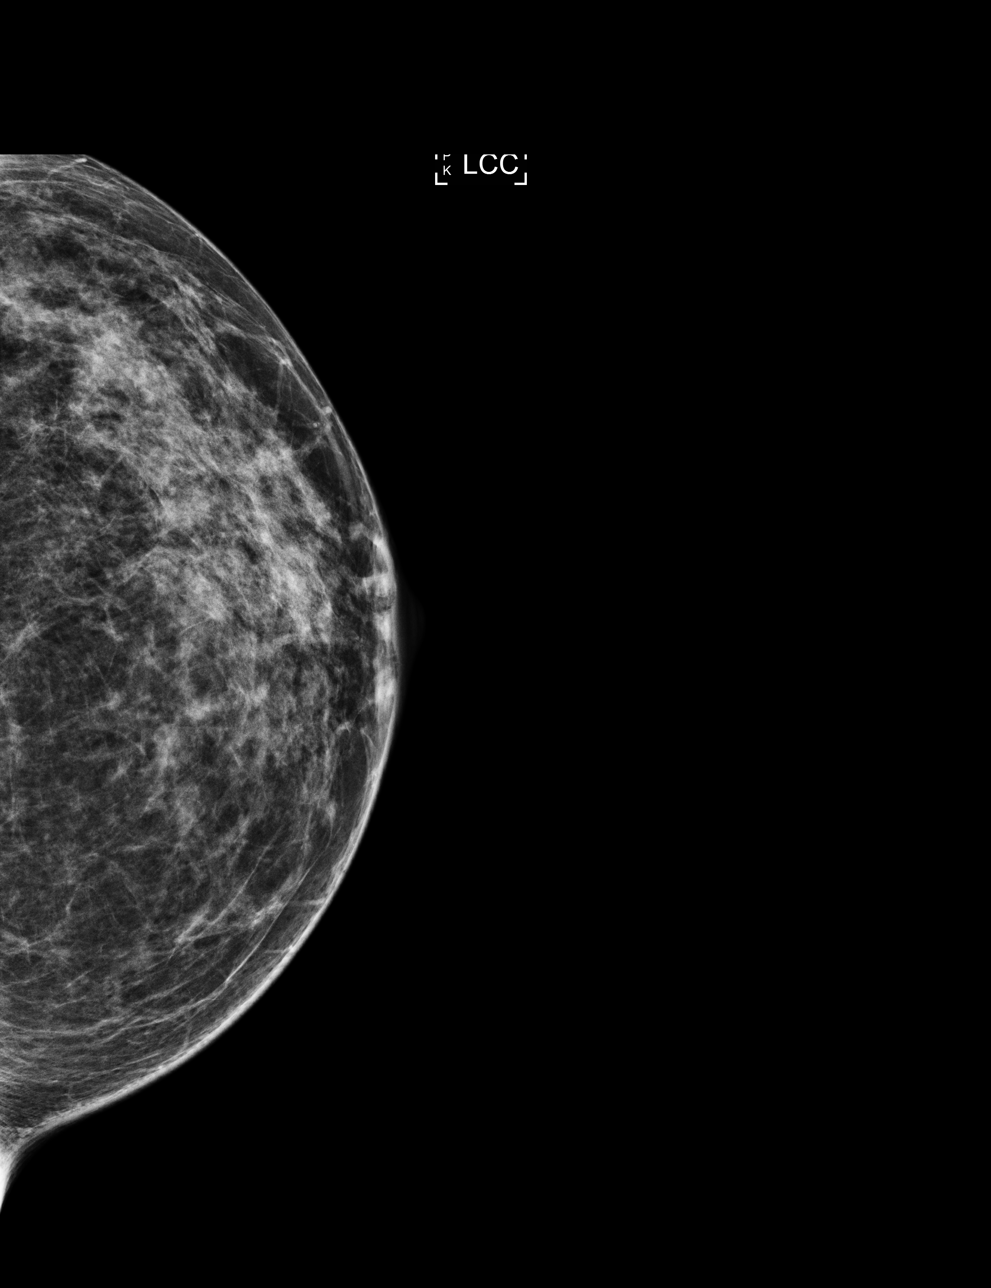

[R CC]
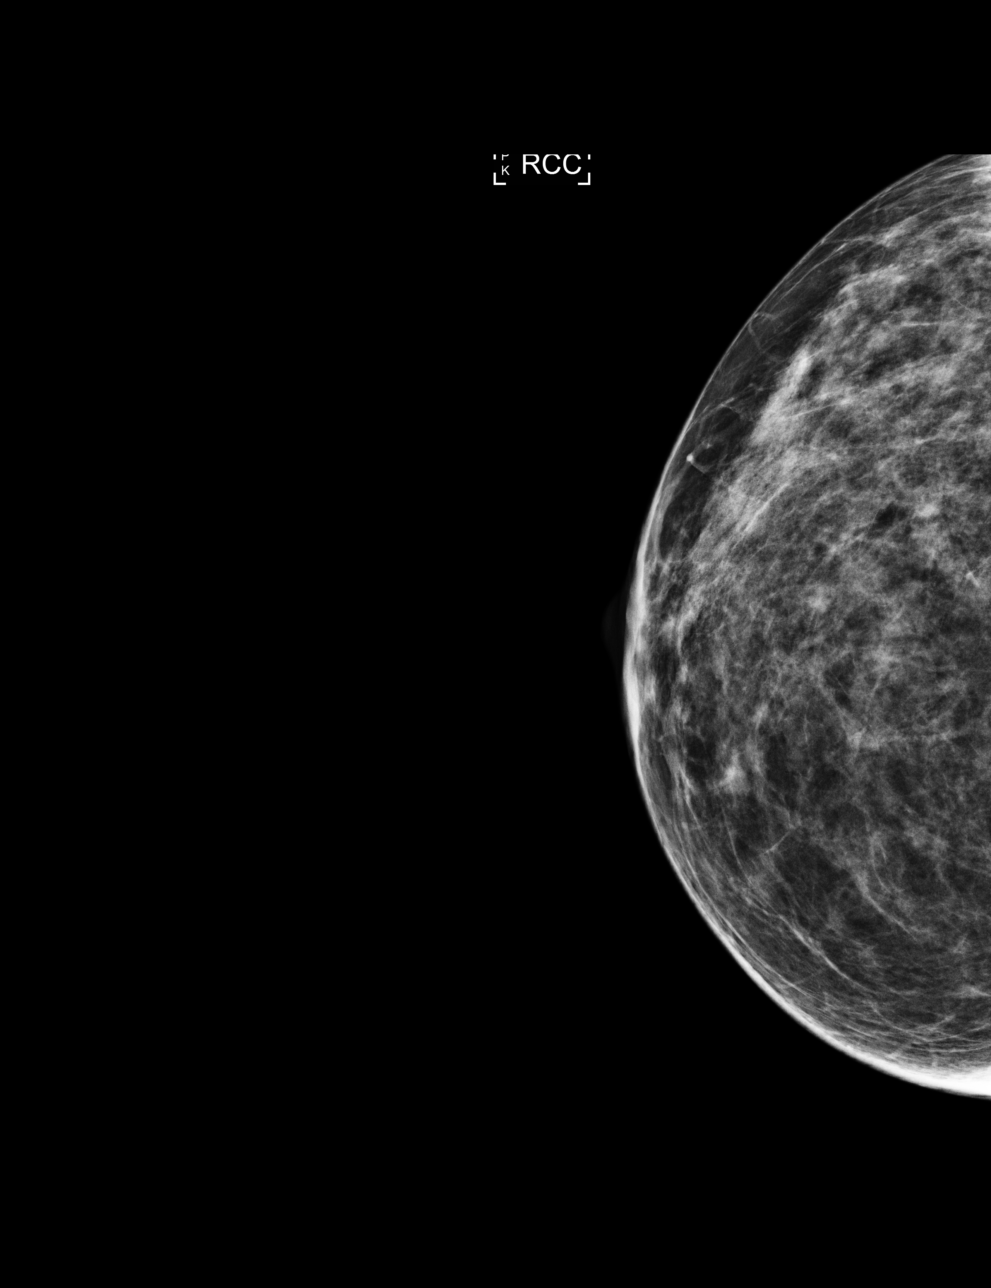

[L MLO]
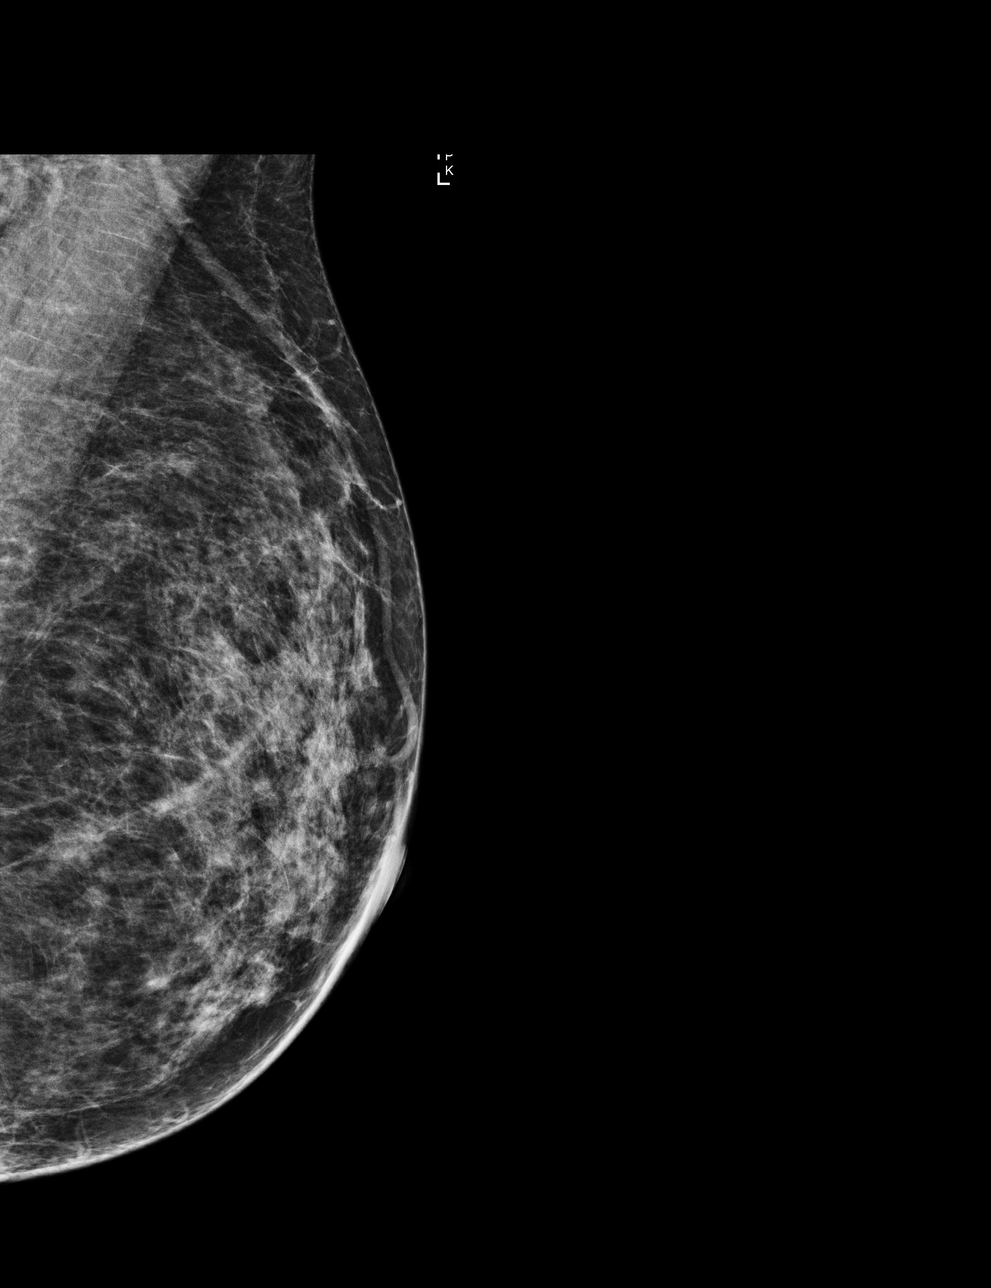

[4 of 4 positions shown; findings below may reference images not displayed]

ACR Breast Density Category c: The breast tissue is heterogeneously
dense, which may obscure small masses
FINDINGS: There are no findings suspicious for malignancy. Images were
processed with CAD.
IMPRESSION: No mammographic evidence of malignancy. A result letter of this
screening mammogram will be mailed directly to the patient.

RECOMMENDATION:
Screening mammogram in one year. (Code:[id number])

BI-RADS CATEGORY  1: Negative.

## 2018-03-25 ENCOUNTER — Ambulatory Visit (INDEPENDENT_AMBULATORY_CARE_PROVIDER_SITE_OTHER): Payer: BLUE CROSS/BLUE SHIELD | Admitting: Obstetrics & Gynecology

## 2018-03-25 ENCOUNTER — Encounter: Payer: Self-pay | Admitting: Obstetrics & Gynecology

## 2018-03-25 VITALS — BP 124/80 | Temp 98.6°F

## 2018-03-25 DIAGNOSIS — R102 Pelvic and perineal pain: Secondary | ICD-10-CM

## 2018-03-25 NOTE — Progress Notes (Signed)
    [name] 1976/07/28 [id number]        41 y.o.  G1P1L1 Married   RP: Acute Right Lower Abdominal pain x a few hours yesterday  HPI: Started on the Progestin-only pill to control symptoms of adenomyosis/endometriosis, but stopped it due to BTB.  Menstrual cycles normal every month with manageable dysmeno and moderate flow.  LMP 03/22/2018 normal until yesterday morning when had a severe RLQ pain which got better after about 2 hours when passed a bowel movement which was looser than usual.  Not sexually active recently.  Normal vaginal secretions.  No UTI Sx.  No fever.   OB History  Gravida Para Term Preterm AB Living  1 1       1   SAB TAB Ectopic Multiple Live Births               # Outcome Date GA Lbr Len/2nd Weight Sex Delivery Anes PTL Lv  1 Para             Past medical history,surgical history, problem list, medications, allergies, family history and social history were all reviewed and documented in the EPIC chart.   Directed ROS with pertinent positives and negatives documented in the history of present illness/assessment and plan.  Exam:  Vitals:   03/25/18 0811  BP: 124/80  Temp: 98.6 F (37 C)  TempSrc: Oral   General appearance:  Normal  Abdomen: Soft, non-tender, not distended, no mass  Gynecologic exam: Vulva normal:  Bimanual exam: Uterus anteverted normal volume and nontender.  No adnexal mass felt bilaterally and nontender bilaterally.   Assessment/Plan:  42 y.o. G1P1   1. Pelvic pain in female Resolved acute left lower quadrant pain during the menstrual period.  Normal gynecologic exam currently.  Possible rupture of a right functional cyst, or dysmenorrhea associated with adenomyosis or possibly intestinal origin.  Patient declines attempting to control her cycle with progestins, including Depo-Provera.  Will observe at this time and patient will follow-up for pelvic ultrasound to further investigate. - US Transvaginal Non-OB; Future  Counseling on  above issues and coordination of care more than 50% for 25 minutes.  Princess Bruins MD, 8:15 AM 03/25/2018

## 2018-03-25 NOTE — Patient Instructions (Signed)
1. Pelvic pain in female Resolved acute left lower quadrant pain during the menstrual period.  Normal gynecologic exam currently.  Possible rupture of a right functional cyst, or dysmenorrhea associated with adenomyosis or possibly intestinal origin.  Patient declines attempting to control her cycle with progestins, including Depo-Provera.  Will observe at this time and patient will follow-up for pelvic ultrasound to further investigate. - US Transvaginal Non-OB; Future  [name], it was a pleasure seeing you today!

## 2018-04-16 ENCOUNTER — Ambulatory Visit (INDEPENDENT_AMBULATORY_CARE_PROVIDER_SITE_OTHER): Payer: BLUE CROSS/BLUE SHIELD | Admitting: Obstetrics & Gynecology

## 2018-04-16 ENCOUNTER — Ambulatory Visit (INDEPENDENT_AMBULATORY_CARE_PROVIDER_SITE_OTHER): Payer: BLUE CROSS/BLUE SHIELD

## 2018-04-16 ENCOUNTER — Other Ambulatory Visit: Payer: Self-pay | Admitting: Obstetrics & Gynecology

## 2018-04-16 ENCOUNTER — Encounter: Payer: Self-pay | Admitting: Obstetrics & Gynecology

## 2018-04-16 VITALS — BP 116/72

## 2018-04-16 DIAGNOSIS — R1031 Right lower quadrant pain: Secondary | ICD-10-CM

## 2018-04-16 DIAGNOSIS — N831 Corpus luteum cyst of ovary, unspecified side: Secondary | ICD-10-CM | POA: Diagnosis not present

## 2018-04-16 DIAGNOSIS — D251 Intramural leiomyoma of uterus: Secondary | ICD-10-CM

## 2018-04-16 DIAGNOSIS — R102 Pelvic and perineal pain unspecified side: Secondary | ICD-10-CM

## 2018-04-16 DIAGNOSIS — Z8742 Personal history of other diseases of the female genital tract: Secondary | ICD-10-CM | POA: Diagnosis not present

## 2018-04-16 NOTE — Progress Notes (Signed)
    [name] 1976-03-24 [id number]        41 y.o.  G1P1L1 Married.  Daughter is 39 yo, doing well.   RP: RLQ pain for Pelvic US  HPI:  No pelvic pain today.  Menstrual periods regular every month with normal flow and manageable dysmenorrhea.  Currently abstinent, declines contraceptives.  H/O Adenomyosis and Endometriosis.   OB History  Gravida Para Term Preterm AB Living  1 1       1   SAB TAB Ectopic Multiple Live Births               # Outcome Date GA Lbr Len/2nd Weight Sex Delivery Anes PTL Lv  1 Para             Past medical history,surgical history, problem list, medications, allergies, family history and social history were all reviewed and documented in the EPIC chart.   Directed ROS with pertinent positives and negatives documented in the history of present illness/assessment and plan.  Exam:  Vitals:   04/16/18 0901  BP: 116/72   General appearance:  Normal  Pelvic US today: T/V images.  Anteverted uterus heterogeneous measuring 7.56 x 6.14 x 5.01 cm.  Left intramural fibroid measuring 2.9 x 1.8 x 1.9 cm.  Normal endometrial line at 7.3 mm.  Left ovary normal.  Right ovary with a collapsed wall corpus luteum cyst measuring 2.2 x 1.1 x 2.2 cm.  No free fluid in the posterior cul-de-sac.   Assessment/Plan:  42 y.o. G1P1   1. RLQ abdominal pain Resolved RLQ pain.  Pelvic US findings reviewed with patient.  Resolving right corpus luteum cyst.  Small intramural fibroids measured at 2.9 cm.  Patient reassured.  Observing.  2. History of endometriosis H/O Endometriosis and probable Adenomyosis.  Prefers observation without Hormonal control of her cycle.  Counseling on above issues and coordination of care more than 50% for 15 minutes.  Princess Bruins MD, 9:21 AM 04/16/2018

## 2018-04-16 NOTE — Patient Instructions (Signed)
1. RLQ abdominal pain Resolved RLQ pain.  Pelvic US findings reviewed with patient.  Resolving right corpus luteum cyst.  Small intramural fibroids measured at 2.9 cm.  Patient reassured.  Observing.  2. History of endometriosis H/O Endometriosis and probable Adenomyosis.  Prefers observation without Hormonal control of her cycle.  Jeff, it was a pleasure seeing you today!

## 2018-04-17 ENCOUNTER — Ambulatory Visit (INDEPENDENT_AMBULATORY_CARE_PROVIDER_SITE_OTHER): Payer: BLUE CROSS/BLUE SHIELD | Admitting: Family Medicine

## 2018-04-17 ENCOUNTER — Encounter: Payer: Self-pay | Admitting: Family Medicine

## 2018-04-17 VITALS — BP 102/74 | HR 57 | Temp 97.8°F | Resp 12 | Ht 66.25 in | Wt 124.6 lb

## 2018-04-17 DIAGNOSIS — R001 Bradycardia, unspecified: Secondary | ICD-10-CM

## 2018-04-17 DIAGNOSIS — E559 Vitamin D deficiency, unspecified: Secondary | ICD-10-CM

## 2018-04-17 DIAGNOSIS — E039 Hypothyroidism, unspecified: Secondary | ICD-10-CM | POA: Diagnosis not present

## 2018-04-17 DIAGNOSIS — K648 Other hemorrhoids: Secondary | ICD-10-CM

## 2018-04-17 DIAGNOSIS — R109 Unspecified abdominal pain: Secondary | ICD-10-CM | POA: Diagnosis not present

## 2018-04-17 DIAGNOSIS — D709 Neutropenia, unspecified: Secondary | ICD-10-CM

## 2018-04-17 DIAGNOSIS — K7689 Other specified diseases of liver: Secondary | ICD-10-CM

## 2018-04-17 HISTORY — DX: Other hemorrhoids: K64.8

## 2018-04-17 NOTE — Assessment & Plan Note (Signed)
Mildly reduced WBC in June

## 2018-04-17 NOTE — Assessment & Plan Note (Signed)
TSH just checked in June

## 2018-04-17 NOTE — Assessment & Plan Note (Signed)
Saw GI; bleeding last week; resolved

## 2018-04-17 NOTE — Progress Notes (Signed)
BP 102/74   Pulse (!) 57   Temp 97.8 F (36.6 C) (Oral)   Resp 12   Ht 5' 6.25" (1.683 m)   Wt 124 lb 9.6 oz (56.5 kg)   LMP 03/21/2018   SpO2 98%   BMI 19.96 kg/m    Subjective:    Patient ID: [name], female    DOB: [birth year], 42 y.o.   MRN: [id number]  HPI: [name] is a 42 y.o. female  Chief Complaint  Patient presents with  . New Patient (Initial Visit)  . Abdominal Pain    ongoing pain over the last year   HPI Patient is here to establish care She has been having abdominal pain for over the last year She had endometriosis; had laparoscopy; had [place] yesterday Has cramps, thought it was ovulation; now it's all the time Passes gas or has a bowel movement and has some relief Stomach will be upset Used to have a proper stool, but now very soft Last two years going on; has had colonoscopy in 2018 Once last week in [place], eating differently and had bleeding; no pain Eats lots of fibers She has seen GI Used to be on probiotics, not any now Tried IBS medicine Full CT scan done almost two years ago Symptoms are not worse; she feels something on that right side  She lost a tremendous amount of weight at first, and is now stablilized; 135 to 118 pounds or so  Vitamin D deficiency; 50k every week for low vitamin d  Hypothyroidism; last TSH normal  Depression screen [facility] 2/9 04/17/2018 03/06/2018  Decreased Interest 0 0  Down, Depressed, Hopeless 0 0  PHQ - 2 Score 0 0    Relevant past medical, surgical, family and social history reviewed Past Medical History:  Diagnosis Date  . Endometriosis   . Herniated disc   . Internal hemorrhoid 04/17/2018   Noted on colonoscopy Jan 2018   Past Surgical History:  Procedure Laterality Date  . PELVIC LAPAROSCOPY     Family History  Problem Relation Age of Onset  . Depression Other   . Thyroid disease Other   . Thyroid disease Mother   . Diabetes Mother   . Diabetes Father   . Diabetes Maternal Grandmother   .  Heart disease Maternal Grandmother   . Heart disease Maternal Grandfather    Social History   Tobacco Use  . Smoking status: Never Smoker  . Smokeless tobacco: Never Used  Substance Use Topics  . Alcohol use: No    Alcohol/week: 0.0 oz  . Drug use: No    Interim medical history since last visit reviewed. Allergies and medications reviewed  Review of Systems Per HPI unless specifically indicated above     Objective:    BP 102/74   Pulse (!) 57   Temp 97.8 F (36.6 C) (Oral)   Resp 12   Ht 5' 6.25" (1.683 m)   Wt 124 lb 9.6 oz (56.5 kg)   LMP 03/21/2018   SpO2 98%   BMI 19.96 kg/m   Wt Readings from Last 3 Encounters:  04/17/18 124 lb 9.6 oz (56.5 kg)  03/06/18 120 lb (54.4 kg)  04/22/17 116 lb (52.6 kg)    Physical Exam  Constitutional: She appears well-developed and well-nourished. No distress.  HENT:  Head: Normocephalic and atraumatic.  Eyes: EOM are normal. No scleral icterus.  Neck: No thyromegaly present.  Cardiovascular: Regular rhythm and normal heart sounds. Bradycardia present.  No murmur heard.  Borderline bradycardia  Pulmonary/Chest: Effort normal and breath sounds normal. No respiratory distress. She has no wheezes.  Abdominal: Soft. Bowel sounds are normal. She exhibits no distension.  Musculoskeletal: She exhibits no edema.  Neurological: She is alert.  Skin: Skin is warm and dry. She is not diaphoretic. No pallor.  Psychiatric: She has a normal mood and affect. Her behavior is normal. Judgment and thought content normal.      Assessment & Plan:   Problem List Items Addressed This Visit      Cardiovascular and Mediastinum   Internal hemorrhoid    Saw GI; bleeding last week; resolved        Endocrine   Hypothyroidism (Chronic)    TSH just checked in June        Other   Neutropenia ([place])    Mildly reduced WBC in June      Vitamin D deficiency    On replacement prescribed by outside provider       Other Visit Diagnoses     Abdominal pain, unspecified abdominal location    -  Primary   chronic issue; patient interested in checking another [place]   Relevant Orders   US Abdomen Complete   Bradycardia       normal TSH recently; not worrisome   Liver cyst       Relevant Orders   US Abdomen Complete       Follow up plan: Return in about 6 months (around 10/18/2018) for follow-up visit with Dr. Sanda Klein.  An after-visit summary was printed and given to the patient at [place].  Please see the patient instructions which may contain other information and recommendations beyond what is mentioned above in the assessment and plan.  No orders of the defined types were placed in this encounter.   Orders Placed This Encounter  Procedures  . US Abdomen Complete

## 2018-04-17 NOTE — Patient Instructions (Addendum)
Please call [phone] to schedule your imaging test (abdominal ultrasound) Please wait 2-3 days after the order has been placed to call and get your test scheduled  Try the low FODMAP diet Keep me posted on any changes and let me know if I may make any referrals for you

## 2018-04-22 ENCOUNTER — Other Ambulatory Visit: Payer: Self-pay | Admitting: Family Medicine

## 2018-04-22 DIAGNOSIS — E559 Vitamin D deficiency, unspecified: Secondary | ICD-10-CM | POA: Insufficient documentation

## 2018-04-22 NOTE — Telephone Encounter (Signed)
Please see refill request. Thanks-TLG

## 2018-04-22 NOTE — Assessment & Plan Note (Signed)
On replacement prescribed by outside provider

## 2018-04-22 NOTE — Telephone Encounter (Signed)
Lab Results  Component Value Date   TSH 4.30 03/06/2018   T3TOTAL 111 10/12/2014   T4TOTAL 8.3 10/12/2014

## 2018-04-23 ENCOUNTER — Ambulatory Visit
Admission: RE | Admit: 2018-04-23 | Discharge: 2018-04-23 | Disposition: A | Discharge status: Home / Self Care | Payer: BLUE CROSS/BLUE SHIELD | Source: Ambulatory Visit | Attending: Family Medicine | Admitting: Family Medicine

## 2018-04-23 DIAGNOSIS — K7689 Other specified diseases of liver: Secondary | ICD-10-CM | POA: Diagnosis not present

## 2018-04-23 DIAGNOSIS — R109 Unspecified abdominal pain: Secondary | ICD-10-CM | POA: Diagnosis not present

## 2018-06-16 ENCOUNTER — Other Ambulatory Visit: Payer: Self-pay | Admitting: Family Medicine

## 2018-07-01 ENCOUNTER — Encounter: Payer: BLUE CROSS/BLUE SHIELD | Admitting: Obstetrics & Gynecology

## 2018-08-28 ENCOUNTER — Ambulatory Visit (INDEPENDENT_AMBULATORY_CARE_PROVIDER_SITE_OTHER): Payer: BLUE CROSS/BLUE SHIELD

## 2018-08-28 DIAGNOSIS — Z23 Encounter for immunization: Secondary | ICD-10-CM

## 2018-09-30 DIAGNOSIS — H0013 Chalazion right eye, unspecified eyelid: Secondary | ICD-10-CM | POA: Diagnosis not present

## 2018-11-17 DIAGNOSIS — B078 Other viral warts: Secondary | ICD-10-CM | POA: Diagnosis not present

## 2018-11-17 DIAGNOSIS — L718 Other rosacea: Secondary | ICD-10-CM | POA: Diagnosis not present

## 2019-01-16 ENCOUNTER — Encounter: Payer: BLUE CROSS/BLUE SHIELD | Admitting: Obstetrics & Gynecology

## 2019-03-10 ENCOUNTER — Encounter: Payer: BLUE CROSS/BLUE SHIELD | Admitting: Family Medicine

## 2019-04-01 ENCOUNTER — Encounter: Payer: BLUE CROSS/BLUE SHIELD | Admitting: Obstetrics & Gynecology

## 2019-05-27 ENCOUNTER — Encounter: Payer: BC Managed Care – PPO | Admitting: Obstetrics & Gynecology

## 2019-06-05 ENCOUNTER — Telehealth: Payer: Self-pay | Admitting: Family Medicine

## 2019-06-05 NOTE — Telephone Encounter (Signed)
Requested medication (s) are due for refill today: yes  Requested medication (s) are on the active medication list: yes  Last refill: 03/05/2019  Future visit scheduled: no  Notes to clinic:  Review for refill   Requested Prescriptions  Pending Prescriptions Disp Refills   levothyroxine (SYNTHROID) 25 MCG tablet [Pharmacy Med Name: LEVOTHYROXINE 25 MCG TABLET] 90 tablet 3    Sig: Take 1 tablet (25 mcg total) by mouth daily before breakfast.     There is no refill protocol information for this order

## 2019-06-08 NOTE — Telephone Encounter (Signed)
Tried to call for scheduling and there was no answer no option for vm

## 2019-06-08 NOTE — Telephone Encounter (Signed)
One month refill sent in, office staff attempted to contact pt w/o answer or ability to leave voice mail. Pt will not be able to get any further refills until OV.    Delsa Grana, PA-C

## 2019-06-30 ENCOUNTER — Telehealth: Payer: Self-pay | Admitting: Family Medicine

## 2019-06-30 NOTE — Telephone Encounter (Signed)
Please schedule patient for follow up ASAP - has not been seen in over a year.

## 2019-06-30 NOTE — Telephone Encounter (Signed)
lvm for scheduling °

## 2019-07-15 ENCOUNTER — Telehealth: Payer: Self-pay | Admitting: Family Medicine

## 2019-07-15 NOTE — Telephone Encounter (Signed)
Called to schedule pt for flu shot. Debbie said she will sign off on it since patient is not a [place] pt. Lvm asking her to call office.

## 2019-07-16 ENCOUNTER — Ambulatory Visit (INDEPENDENT_AMBULATORY_CARE_PROVIDER_SITE_OTHER): Payer: BC Managed Care – PPO

## 2019-07-16 DIAGNOSIS — Z23 Encounter for immunization: Secondary | ICD-10-CM

## 2019-08-18 ENCOUNTER — Telehealth: Payer: Self-pay | Admitting: Family Medicine

## 2019-08-18 NOTE — Telephone Encounter (Signed)
#  7 tabs given, needs follow up in the next week - has not been seen in over a year.

## 2019-08-19 NOTE — Telephone Encounter (Signed)
lvm to sch appt only a limited supply of medication has been sent to pharmacy.

## 2019-08-25 ENCOUNTER — Other Ambulatory Visit: Payer: Self-pay | Admitting: Family Medicine

## 2019-08-31 ENCOUNTER — Other Ambulatory Visit: Payer: Self-pay | Admitting: Family Medicine

## 2019-09-02 ENCOUNTER — Ambulatory Visit (INDEPENDENT_AMBULATORY_CARE_PROVIDER_SITE_OTHER): Payer: BLUE CROSS/BLUE SHIELD | Admitting: Family Medicine

## 2019-09-02 ENCOUNTER — Encounter: Payer: Self-pay | Admitting: Family Medicine

## 2019-09-02 ENCOUNTER — Other Ambulatory Visit: Payer: Self-pay

## 2019-09-02 DIAGNOSIS — E039 Hypothyroidism, unspecified: Secondary | ICD-10-CM | POA: Diagnosis not present

## 2019-09-02 DIAGNOSIS — D709 Neutropenia, unspecified: Secondary | ICD-10-CM

## 2019-09-02 DIAGNOSIS — Z13220 Encounter for screening for lipoid disorders: Secondary | ICD-10-CM

## 2019-09-02 DIAGNOSIS — E559 Vitamin D deficiency, unspecified: Secondary | ICD-10-CM

## 2019-09-02 DIAGNOSIS — K580 Irritable bowel syndrome with diarrhea: Secondary | ICD-10-CM | POA: Diagnosis not present

## 2019-09-02 NOTE — Progress Notes (Signed)
Name: [name]   MRN: [id number]    DOB: [birth year]   Date:09/02/2019       Progress Note  Subjective  Chief Complaint  Chief Complaint  Patient presents with  . Medication Refill    levothyroxine     I connected with  Edward Mauch  on 09/02/19 at  9:20 AM EST by a video enabled telemedicine application and verified that I am speaking with the correct person using two identifiers.  I discussed the limitations of evaluation and management by telemedicine and the availability of in person appointments. The patient expressed understanding and agreed to proceed. Staff also discussed with the patient that there may be a patient responsible charge related to this service. Patient Location: Home Provider Location: Office Additional Individuals present: none  HPI  Hypothyroidism: History: hypothyroidism Current Medication Regimen: 26mcg synthroid daily.  Weight has been stable at 130lbs Current Symptoms: denies fatigue, weight changes, heat/cold intolerance, bowel/skin changes (see below regarding IBS) or CVS symptoms Most recent results are below; we will be repeating labs today. Lab Results  Component Value Date   TSH 4.30 03/06/2018   T3TOTAL 111 10/12/2014   T4TOTAL 8.3 10/12/2014   IBS: She has always had "stomach issues"; had colonscopy in 2018 that found internal hemorrhoids.  She does see GI specialist. She is very picky about taking medication, has not been taking probiotic. Does find spicy foods and hormonal changes in her cycle worsen symptoms.   Vitamin D Def: She is taking multivitamin, she is not taking any additional supplement  Neutropenia: due for labs.  Patient Active Problem List   Diagnosis Date Noted  . Vitamin D deficiency 04/22/2018  . Neutropenia ([place]) 04/17/2018  . Internal hemorrhoid 04/17/2018  . IBS (irritable bowel syndrome) 03/06/2018  . Hypothyroidism 11/14/2016  . Endometriosis 09/02/2014  . Liver hemangioma 10/03/2011    Past Surgical  History:  Procedure Laterality Date  . PELVIC LAPAROSCOPY      Family History  Problem Relation Age of Onset  . Depression Other   . Thyroid disease Other   . Thyroid disease Mother   . Diabetes Mother   . Diabetes Father   . Diabetes Maternal Grandmother   . Heart disease Maternal Grandmother   . Heart disease Maternal Grandfather     Social History   Socioeconomic History  . Marital status: Married    Spouse name: Not on file  . Number of children: 1  . Years of education: Not on file  . Highest education level: Not on file  Occupational History    Employer: [employer]  Tobacco Use  . Smoking status: Never Smoker  . Smokeless tobacco: Never Used  Substance and Sexual Activity  . Alcohol use: No    Alcohol/week: 0.0 standard drinks  . Drug use: No  . Sexual activity: Yes    Partners: Male    Birth control/protection: None    Comment: 1st intercourse- 52 partners-  1, married- 80 yrs   Other Topics Concern  . Not on file  Social History Narrative  . Not on file   Social Determinants of Health   Financial Resource Strain:   . Difficulty of Paying Living Expenses: Not on file  Food Insecurity:   . Worried About Charity fundraiser in the Last Year: Not on file  . Ran Out of Food in the Last Year: Not on file  Transportation Needs:   . Lack of Transportation (Medical): Not on file  . Lack  of Transportation (Non-Medical): Not on file  Physical Activity:   . Days of Exercise per Week: Not on file  . Minutes of Exercise per Session: Not on file  Stress:   . Feeling of Stress : Not on file  Social Connections:   . Frequency of Communication with Friends and Family: Not on file  . Frequency of Social Gatherings with Friends and Family: Not on file  . Attends Religious Services: Not on file  . Active Member of Clubs or Organizations: Not on file  . Attends Archivist Meetings: Not on file  . Marital Status: Not on file  Intimate Partner Violence:     . Fear of Current or Ex-Partner: Not on file  . Emotionally Abused: Not on file  . Physically Abused: Not on file  . Sexually Abused: Not on file     Current Outpatient Medications:  .  levothyroxine (SYNTHROID) 25 MCG tablet, TAKE 1 TABLET (25 MCG TOTAL) BY MOUTH DAILY BEFORE BREAKFAST., Disp: 7 tablet, Rfl: 0 .  Vitamin D, Ergocalciferol, (DRISDOL) 50000 units CAPS capsule, Take 50,000 Units by mouth every 7 (seven) days., Disp: , Rfl:   No Known Allergies  I personally reviewed active problem list, medication list, allergies, notes from last encounter, lab results with the patient/caregiver today.   ROS  Constitutional: Negative for fever or weight change.  Respiratory: Negative for cough and shortness of breath.   Cardiovascular: Negative for chest pain or palpitations.  Gastrointestinal: Negative for abdominal pain, no bowel changes.  Musculoskeletal: Negative for gait problem or joint swelling.  Skin: Negative for rash.  Neurological: Negative for dizziness or headache.  No other specific complaints in a complete review of systems (except as listed in HPI above).  Objective  Virtual encounter, vitals not obtained.  There is no height or weight on file to calculate BMI.  Physical Exam  Constitutional: Patient appears well-developed and well-nourished. No distress.  HENT: Head: Normocephalic and atraumatic.  Neck: Normal range of motion. Pulmonary/Chest: Effort normal. No respiratory distress. Speaking in complete sentences Neurological: Pt is alert and oriented to person, place, and time. Coordination, speech are normal.  Psychiatric: Patient has a normal mood and affect. behavior is normal. Judgment and thought content normal.  No results found for this or any previous visit (from the past 72 hour(s)).  PHQ2/9: Depression screen [facility] 2/9 09/02/2019 04/17/2018 03/06/2018  Decreased Interest 0 0 0  Down, Depressed, Hopeless 0 0 0  PHQ - 2 Score 0 0 0  Altered  sleeping 0 - -  Tired, decreased energy 0 - -  Change in appetite 0 - -  Feeling bad or failure about yourself  0 - -  Trouble concentrating 0 - -  Moving slowly or fidgety/restless 0 - -  Suicidal thoughts 0 - -  PHQ-9 Score 0 - -  Difficult doing work/chores Not difficult at all - -   PHQ-2/9 Result is negative.    Fall Risk: Fall Risk  09/02/2019 04/17/2018 03/06/2018  Falls in the past year? 0 No No  Number falls in past yr: 0 - -  Injury with Fall? 0 - -    Assessment & Plan  1. Hypothyroidism, unspecified type - TSH - Continue current regimen at this time.  2. Neutropenia, unspecified type ([place]) - CBC with Differential/Platelet  3. Vitamin D deficiency - VITAMIN D 25 Hydroxy (Vit-D Deficiency, Fractures)  4. Irritable bowel syndrome with diarrhea - Encouraged her to ID possible trigger foods, though it  may be more so related to her cycle and endometriosis.  No medications used at this time; continue PRN follow up with GI. - CBC with Differential/Platelet - COMPLETE METABOLIC PANEL WITH GFR  5. Lipid screening - Lipid panel   I discussed the assessment and treatment plan with the patient. The patient was provided an opportunity to ask questions and all were answered. The patient agreed with the plan and demonstrated an understanding of the instructions.  The patient was advised to call back or seek an in-person evaluation if the symptoms worsen or if the condition fails to improve as anticipated.  I provided 19 minutes of non-face-to-face time during this encounter.

## 2019-09-04 DIAGNOSIS — E559 Vitamin D deficiency, unspecified: Secondary | ICD-10-CM | POA: Diagnosis not present

## 2019-09-04 DIAGNOSIS — D709 Neutropenia, unspecified: Secondary | ICD-10-CM | POA: Diagnosis not present

## 2019-09-04 DIAGNOSIS — Z13220 Encounter for screening for lipoid disorders: Secondary | ICD-10-CM | POA: Diagnosis not present

## 2019-09-04 DIAGNOSIS — E039 Hypothyroidism, unspecified: Secondary | ICD-10-CM | POA: Diagnosis not present

## 2019-09-05 LAB — COMPLETE METABOLIC PANEL WITH GFR
AG Ratio: 1.5 (calc) (ref 1.0–2.5)
ALT: 11 U/L (ref 6–29)
AST: 16 U/L (ref 10–30)
Albumin: 4.1 g/dL (ref 3.6–5.1)
Alkaline phosphatase (APISO): 46 U/L (ref 31–125)
BUN: 9 mg/dL (ref 7–25)
CO2: 25 mmol/L (ref 20–32)
Calcium: 9.1 mg/dL (ref 8.6–10.2)
Chloride: 106 mmol/L (ref 98–110)
Creat: 0.69 mg/dL (ref 0.50–1.10)
GFR, Est African American: 124 mL/min/{1.73_m2} (ref >=60)
GFR, Est Non African American: 107 mL/min/{1.73_m2} (ref >=60)
Globulin: 2.8 g/dL (calc) (ref 1.9–3.7)
Glucose, Bld: 91 mg/dL (ref 65–99)
Potassium: 4.2 mmol/L (ref 3.5–5.3)
Sodium: 139 mmol/L (ref 135–146)
Total Bilirubin: 0.8 mg/dL (ref 0.2–1.2)
Total Protein: 6.9 g/dL (ref 6.1–8.1)

## 2019-09-05 LAB — LIPID PANEL
Cholesterol: 131 mg/dL (ref <200)
HDL: 52 mg/dL (ref >=50)
LDL Cholesterol (Calc): 68 mg/dL (calc)
Non-HDL Cholesterol (Calc): 79 mg/dL (calc) (ref <130)
Total CHOL/HDL Ratio: 2.5 (calc) (ref <5.0)
Triglycerides: 39 mg/dL (ref <150)

## 2019-09-05 LAB — CBC WITH DIFFERENTIAL/PLATELET
Absolute Monocytes: 383 cells/uL (ref 200–950)
Basophils Absolute: 31 cells/uL (ref 0–200)
Basophils Relative: 0.7 %
Eosinophils Absolute: 163 cells/uL (ref 15–500)
Eosinophils Relative: 3.7 %
HCT: 37.8 % (ref 35.0–45.0)
Hemoglobin: 11.9 g/dL (ref 11.7–15.5)
Lymphs Abs: 1989 cells/uL (ref 850–3900)
MCH: 26.6 pg — ABNORMAL LOW (ref 27.0–33.0)
MCHC: 31.5 g/dL — ABNORMAL LOW (ref 32.0–36.0)
MCV: 84.4 fL (ref 80.0–100.0)
MPV: 11.1 fL (ref 7.5–12.5)
Monocytes Relative: 8.7 %
Neutro Abs: 1835 cells/uL (ref 1500–7800)
Neutrophils Relative %: 41.7 %
Platelets: 257 10*3/uL (ref 140–400)
RBC: 4.48 10*6/uL (ref 3.80–5.10)
RDW: 12.5 % (ref 11.0–15.0)
Total Lymphocyte: 45.2 %
WBC: 4.4 10*3/uL (ref 3.8–10.8)

## 2019-09-05 LAB — TSH: TSH: 5.95 mIU/L — ABNORMAL HIGH

## 2019-09-05 LAB — VITAMIN D 25 HYDROXY (VIT D DEFICIENCY, FRACTURES): Vit D, 25-Hydroxy: 16 ng/mL — ABNORMAL LOW (ref 30–100)

## 2019-09-07 ENCOUNTER — Encounter: Payer: Self-pay | Admitting: Family Medicine

## 2019-09-07 ENCOUNTER — Other Ambulatory Visit: Payer: Self-pay | Admitting: Family Medicine

## 2019-09-07 DIAGNOSIS — E039 Hypothyroidism, unspecified: Secondary | ICD-10-CM

## 2019-09-07 DIAGNOSIS — E559 Vitamin D deficiency, unspecified: Secondary | ICD-10-CM

## 2019-09-07 MED ORDER — LEVOTHYROXINE SODIUM 25 MCG PO TABS: ORAL_TABLET | ORAL | 1 refills | Status: DC

## 2019-09-07 MED ORDER — VITAMIN D (ERGOCALCIFEROL) 1.25 MG (50000 UNIT) PO CAPS: 50000.0000 [IU] | ORAL_CAPSULE | ORAL | 0 refills | Status: DC

## 2019-10-27 ENCOUNTER — Other Ambulatory Visit: Payer: Self-pay | Admitting: Family Medicine

## 2019-10-27 DIAGNOSIS — E559 Vitamin D deficiency, unspecified: Secondary | ICD-10-CM

## 2019-10-27 NOTE — Telephone Encounter (Signed)
Left message for patient to come for labs

## 2019-10-27 NOTE — Telephone Encounter (Signed)
-----   Message from Hubbard Hartshorn, [place] sent at 09/07/2019  7:26 AM EST ----- Regarding: Please call patient to come in for TSH recheck Please call patient to come in for TSH recheck

## 2019-12-30 DIAGNOSIS — E039 Hypothyroidism, unspecified: Secondary | ICD-10-CM | POA: Diagnosis not present

## 2019-12-31 LAB — TSH: TSH: 4.07 mIU/L

## 2020-01-05 ENCOUNTER — Other Ambulatory Visit: Payer: Self-pay

## 2020-01-05 MED ORDER — LEVOTHYROXINE SODIUM 25 MCG PO TABS: ORAL_TABLET | ORAL | 1 refills | Status: DC

## 2020-06-28 ENCOUNTER — Encounter: Payer: Self-pay | Admitting: Family Medicine

## 2020-06-28 ENCOUNTER — Other Ambulatory Visit: Payer: Self-pay

## 2020-06-28 ENCOUNTER — Ambulatory Visit (INDEPENDENT_AMBULATORY_CARE_PROVIDER_SITE_OTHER): Payer: BC Managed Care – PPO | Admitting: Family Medicine

## 2020-06-28 VITALS — BP 110/78 | HR 67 | Temp 97.9°F | Ht 64.75 in | Wt 136.2 lb

## 2020-06-28 DIAGNOSIS — E039 Hypothyroidism, unspecified: Secondary | ICD-10-CM

## 2020-06-28 DIAGNOSIS — R1011 Right upper quadrant pain: Secondary | ICD-10-CM | POA: Diagnosis not present

## 2020-06-28 MED ORDER — LEVOTHYROXINE SODIUM 25 MCG PO TABS: 25.0000 ug | ORAL_TABLET | Freq: Every day | ORAL | 3 refills | Status: DC

## 2020-06-28 NOTE — Patient Instructions (Addendum)
#  Hypothyroidism - return to 1 pill daily  Abdominal pain - make an appointment if pain returns

## 2020-06-28 NOTE — Assessment & Plan Note (Signed)
Normal GB study in 2015 and normal [place] in 2019. Notes 2-3 months of recurrent pain but not present today. Discussed could repeat labs vs [place]. She has GYN f/u and will also discuss given correlation with cycle and hx of endometriosis. Decided to watch and wait and return when she is in pain.

## 2020-06-28 NOTE — Progress Notes (Signed)
Subjective:     [name] is a 44 y.o. female presenting for Establish Care     HPI  #hypothyroidism - initially diagnosed in the setting of significant weight loss - thought it may have been thyroid related - was diagnosed with irritable bowel syndrome and sees GI and has gained weight  - has some good days and also has days with occasional stomach ache  #RUQ - symptoms on and off over 2-3 months - diagnosed with IBS and endometriosis - it is worse during cycle - no vomiting - no pain currently - just below the breast  Review of Systems   Social History   Tobacco Use  Smoking Status Never Smoker  Smokeless Tobacco Never Used        Objective:    BP Readings from Last 3 Encounters:  06/28/20 110/78  04/17/18 102/74  04/16/18 116/72   Wt Readings from Last 3 Encounters:  06/28/20 136 lb 4 oz (61.8 kg)  04/17/18 124 lb 9.6 oz (56.5 kg)  03/06/18 120 lb (54.4 kg)    BP 110/78   Pulse 67   Temp 97.9 F (36.6 C) (Temporal)   Ht 5' 4.75" (1.645 m)   Wt 136 lb 4 oz (61.8 kg)   LMP 05/31/2020 (Exact Date)   SpO2 99%   BMI 22.85 kg/m    Physical Exam Constitutional:      General: She is not in acute distress.    Appearance: She is well-developed. She is not diaphoretic.  HENT:     Right Ear: External ear normal.     Left Ear: External ear normal.  Eyes:     Conjunctiva/sclera: Conjunctivae normal.  Cardiovascular:     Rate and Rhythm: Normal rate and regular rhythm.     Heart sounds: No murmur heard.   Pulmonary:     Effort: Pulmonary effort is normal. No respiratory distress.     Breath sounds: Normal breath sounds. No wheezing.  Abdominal:     General: Abdomen is flat. Bowel sounds are normal. There is no distension.     Tenderness: There is no abdominal tenderness. There is no guarding or rebound. Negative signs include Murphy's sign.     Comments: No rib TTP  Musculoskeletal:     Cervical back: Neck supple.  Skin:    General: Skin is  warm and dry.     Capillary Refill: Capillary refill takes less than 2 seconds.  Neurological:     Mental Status: She is alert. Mental status is at baseline.  Psychiatric:        Mood and Affect: Mood normal.        Behavior: Behavior normal.           Assessment & Plan:   Problem List Items Addressed This Visit      Endocrine   Hypothyroidism - Primary (Chronic)    Reviewed prior labs which are most consistent with subclinical hypothyroidism. Unclear if she has benefit and most recent dose increase was to take 1.5 tablets twice weekly which she often forgot. Will return to 25 mcg daily of levothyroxine and she will call if new/worsening symptoms to repeat TSH. Also discussed she could stop completely, but she will continue this dose.       Relevant Medications   levothyroxine (SYNTHROID) 25 MCG tablet     Other   RUQ abdominal pain    Normal GB study in 2015 and normal [place] in 2019. Notes 2-3 months of recurrent pain  but not present today. Discussed could repeat labs vs [place]. She has GYN f/u and will also discuss given correlation with cycle and hx of endometriosis. Decided to watch and wait and return when she is in pain.           Return in about 1 year (around 06/28/2021).  Lesleigh Noe, MD  This visit occurred during the SARS-CoV-2 public health emergency.  Safety protocols were in place, including screening questions prior to the visit, additional usage of staff PPE, and extensive cleaning of exam room while observing appropriate contact time as indicated for disinfecting solutions.

## 2020-06-28 NOTE — Assessment & Plan Note (Signed)
Reviewed prior labs which are most consistent with subclinical hypothyroidism. Unclear if she has benefit and most recent dose increase was to take 1.5 tablets twice weekly which she often forgot. Will return to 25 mcg daily of levothyroxine and she will call if new/worsening symptoms to repeat TSH. Also discussed she could stop completely, but she will continue this dose.

## 2020-07-14 ENCOUNTER — Ambulatory Visit (INDEPENDENT_AMBULATORY_CARE_PROVIDER_SITE_OTHER): Payer: BC Managed Care – PPO | Admitting: Obstetrics & Gynecology

## 2020-07-14 ENCOUNTER — Other Ambulatory Visit: Payer: Self-pay

## 2020-07-14 ENCOUNTER — Encounter: Payer: Self-pay | Admitting: Obstetrics & Gynecology

## 2020-07-14 VITALS — BP 128/86 | Ht 65.0 in | Wt 135.0 lb

## 2020-07-14 DIAGNOSIS — F525 Vaginismus not due to a substance or known physiological condition: Secondary | ICD-10-CM

## 2020-07-14 DIAGNOSIS — Z01419 Encounter for gynecological examination (general) (routine) without abnormal findings: Secondary | ICD-10-CM | POA: Diagnosis not present

## 2020-07-14 DIAGNOSIS — Z23 Encounter for immunization: Secondary | ICD-10-CM | POA: Diagnosis not present

## 2020-07-14 LAB — HM PAP SMEAR: HM Pap smear: NEGATIVE

## 2020-07-14 NOTE — Progress Notes (Signed)
[name] 1976-09-02 [id number]   History:    44 y.o.  G1P1L1 Married.  Daughter is 27 yo.  RP:  Established patient presenting for Annual Gyn visit  HPI:  Menses regular normal every month.  No BTB.  No pelvic pain.  Abstinent because of long h/o pain with IC/vaginismus.  Normal vaginal secretions. No UTI Sx, BMs wnl.  Breasts normal.  Overdue for screening Mammo.  BMI 22.47.  Good fitness and healthy nutrition.  [place] 2018.   Past medical history,surgical history, family history and social history were all reviewed and documented in the EPIC chart.  Gynecologic History Patient's last menstrual period was 06/30/2020.  Obstetric History OB History  Gravida Para Term Preterm AB Living  1 1       1   SAB TAB Ectopic Multiple Live Births               # Outcome Date GA Lbr Len/2nd Weight Sex Delivery Anes PTL Lv  1 Para              ROS: A ROS was performed and pertinent positives and negatives are included in the history.  GENERAL: No fevers or chills. HEENT: No change in vision, no earache, sore throat or sinus congestion. NECK: No pain or stiffness. CARDIOVASCULAR: No chest pain or pressure. No palpitations. PULMONARY: No shortness of breath, cough or wheeze. GASTROINTESTINAL: No abdominal pain, nausea, vomiting or diarrhea, melena or bright red blood per rectum. GENITOURINARY: No urinary frequency, urgency, hesitancy or dysuria. MUSCULOSKELETAL: No joint or muscle pain, no back pain, no recent trauma. DERMATOLOGIC: No rash, no itching, no lesions. ENDOCRINE: No polyuria, polydipsia, no heat or cold intolerance. No recent change in weight. HEMATOLOGICAL: No anemia or easy bruising or bleeding. NEUROLOGIC: No headache, seizures, numbness, tingling or weakness. PSYCHIATRIC: No depression, no loss of interest in normal activity or change in sleep pattern.     Exam:   BP 128/86   Ht 5\' 5"  (1.651 m)   Wt 135 lb (61.2 kg)   LMP 06/30/2020 Comment: no birth control   BMI 22.47  kg/m   Body mass index is 22.47 kg/m.  General appearance : Well developed well nourished female. No acute distress HEENT: Eyes: no retinal hemorrhage or exudates,  Neck supple, trachea midline, no carotid bruits, no thyroidmegaly Lungs: Clear to auscultation, no rhonchi or wheezes, or rib retractions  Heart: Regular rate and rhythm, no murmurs or gallops Breast:Examined in sitting and supine position were symmetrical in appearance, no palpable masses or tenderness,  no skin retraction, no nipple inversion, no nipple discharge, no skin discoloration, no axillary or supraclavicular lymphadenopathy Abdomen: no palpable masses or tenderness, no rebound or guarding Extremities: no edema or skin discoloration or tenderness  Pelvic: Vulva: Normal             Vagina: No gross lesions or discharge  Cervix: No gross lesions or discharge.  Pap reflex done.  Uterus  AV, normal size, shape and consistency, non-tender and mobile  Adnexa  Without masses or tenderness  Anus: Normal   Assessment/Plan:  44 y.o. female for annual exam   1. Encounter for routine gynecological examination with Papanicolaou smear of cervix Normal gynecologic exam.  Pap reflex done.  Breast exam normal.  Schedule screening mammogram now as patient is overdue.  Very good body mass index at 22.47.  Continue with fitness and healthy nutrition.  2. Functional vaginismus Longstanding pain with intercourse and vaginismus.  No desire to further  attempt treatment at this point.  Abstinent, no need for contraception.  3. Flu vaccine need Flu shot given.  Other orders - Flu Vaccine QUAD 36+ mos IM (Fluarix, Quad PF)  Princess Bruins MD, 10:52 AM 07/14/2020

## 2020-07-15 ENCOUNTER — Encounter: Payer: Self-pay | Admitting: Obstetrics & Gynecology

## 2020-07-16 LAB — PAP IG W/ RFLX HPV ASCU

## 2021-08-04 ENCOUNTER — Ambulatory Visit (INDEPENDENT_AMBULATORY_CARE_PROVIDER_SITE_OTHER): Payer: BC Managed Care – PPO

## 2021-08-04 ENCOUNTER — Other Ambulatory Visit: Payer: Self-pay

## 2021-08-04 DIAGNOSIS — Z23 Encounter for immunization: Secondary | ICD-10-CM | POA: Diagnosis not present

## 2021-08-28 ENCOUNTER — Ambulatory Visit: Payer: BC Managed Care – PPO | Admitting: Obstetrics & Gynecology

## 2021-11-10 ENCOUNTER — Other Ambulatory Visit: Payer: Self-pay | Admitting: Obstetrics & Gynecology

## 2021-11-10 DIAGNOSIS — Z1231 Encounter for screening mammogram for malignant neoplasm of breast: Secondary | ICD-10-CM

## 2021-11-24 ENCOUNTER — Ambulatory Visit
Admission: RE | Admit: 2021-11-24 | Discharge: 2021-11-24 | Disposition: A | Discharge status: Home / Self Care | Payer: BC Managed Care – PPO | Source: Ambulatory Visit | Attending: Obstetrics & Gynecology | Admitting: Obstetrics & Gynecology

## 2021-11-24 DIAGNOSIS — Z1231 Encounter for screening mammogram for malignant neoplasm of breast: Secondary | ICD-10-CM

## 2021-12-06 ENCOUNTER — Other Ambulatory Visit: Payer: BC Managed Care – PPO

## 2021-12-13 ENCOUNTER — Ambulatory Visit (INDEPENDENT_AMBULATORY_CARE_PROVIDER_SITE_OTHER): Payer: BC Managed Care – PPO | Admitting: Family Medicine

## 2021-12-13 ENCOUNTER — Other Ambulatory Visit: Payer: Self-pay

## 2021-12-13 ENCOUNTER — Encounter: Payer: Self-pay | Admitting: Family Medicine

## 2021-12-13 VITALS — BP 100/70 | HR 72 | Temp 97.7°F | Ht 65.5 in | Wt 124.5 lb

## 2021-12-13 DIAGNOSIS — Z114 Encounter for screening for human immunodeficiency virus [HIV]: Secondary | ICD-10-CM | POA: Diagnosis not present

## 2021-12-13 DIAGNOSIS — Z0000 Encounter for general adult medical examination without abnormal findings: Secondary | ICD-10-CM | POA: Diagnosis not present

## 2021-12-13 DIAGNOSIS — Z1159 Encounter for screening for other viral diseases: Secondary | ICD-10-CM

## 2021-12-13 DIAGNOSIS — Z13220 Encounter for screening for lipoid disorders: Secondary | ICD-10-CM

## 2021-12-13 DIAGNOSIS — Z23 Encounter for immunization: Secondary | ICD-10-CM | POA: Diagnosis not present

## 2021-12-13 DIAGNOSIS — E039 Hypothyroidism, unspecified: Secondary | ICD-10-CM

## 2021-12-13 DIAGNOSIS — E559 Vitamin D deficiency, unspecified: Secondary | ICD-10-CM

## 2021-12-13 DIAGNOSIS — K580 Irritable bowel syndrome with diarrhea: Secondary | ICD-10-CM | POA: Diagnosis not present

## 2021-12-13 LAB — COMPREHENSIVE METABOLIC PANEL
ALT: 9 U/L (ref 0–35)
AST: 14 U/L (ref 0–37)
Albumin: 4.3 g/dL (ref 3.5–5.2)
Alkaline Phosphatase: 41 U/L (ref 39–117)
BUN: 16 mg/dL (ref 6–23)
CO2: 27 mEq/L (ref 19–32)
Calcium: 9.3 mg/dL (ref 8.4–10.5)
Chloride: 104 mEq/L (ref 96–112)
Creatinine, Ser: 0.67 mg/dL (ref 0.40–1.20)
GFR: 105.58 mL/min (ref >60.00)
Glucose, Bld: 91 mg/dL (ref 70–99)
Potassium: 4.5 mEq/L (ref 3.5–5.1)
Sodium: 136 mEq/L (ref 135–145)
Total Bilirubin: 0.8 mg/dL (ref 0.2–1.2)
Total Protein: 7 g/dL (ref 6.0–8.3)

## 2021-12-13 LAB — LIPID PANEL
Cholesterol: 130 mg/dL (ref 0–200)
HDL: 50.4 mg/dL (ref >39.00)
LDL Cholesterol: 72 mg/dL (ref 0–99)
NonHDL: 79.72
Total CHOL/HDL Ratio: 3
Triglycerides: 41 mg/dL (ref 0.0–149.0)
VLDL: 8.2 mg/dL (ref 0.0–40.0)

## 2021-12-13 LAB — CBC WITH DIFFERENTIAL/PLATELET
Basophils Absolute: 0 10*3/uL (ref 0.0–0.1)
Basophils Relative: 0.4 % (ref 0.0–3.0)
Eosinophils Absolute: 0.1 10*3/uL (ref 0.0–0.7)
Eosinophils Relative: 2.5 % (ref 0.0–5.0)
HCT: 35.1 % — ABNORMAL LOW (ref 36.0–46.0)
Hemoglobin: 11.3 g/dL — ABNORMAL LOW (ref 12.0–15.0)
Lymphocytes Relative: 42 % (ref 12.0–46.0)
Lymphs Abs: 1.5 10*3/uL (ref 0.7–4.0)
MCHC: 32.2 g/dL (ref 30.0–36.0)
MCV: 82 fl (ref 78.0–100.0)
Monocytes Absolute: 0.3 10*3/uL (ref 0.1–1.0)
Monocytes Relative: 8.6 % (ref 3.0–12.0)
Neutro Abs: 1.6 10*3/uL (ref 1.4–7.7)
Neutrophils Relative %: 46.5 % (ref 43.0–77.0)
Platelets: 242 10*3/uL (ref 150.0–400.0)
RBC: 4.28 Mil/uL (ref 3.87–5.11)
RDW: 15.6 % — ABNORMAL HIGH (ref 11.5–15.5)
WBC: 3.5 10*3/uL — ABNORMAL LOW (ref 4.0–10.5)

## 2021-12-13 LAB — VITAMIN D 25 HYDROXY (VIT D DEFICIENCY, FRACTURES): VITD: 12.62 ng/mL — ABNORMAL LOW (ref 30.00–100.00)

## 2021-12-13 LAB — TSH: TSH: 4.15 u[IU]/mL (ref 0.35–5.50)

## 2021-12-13 MED ORDER — LEVOTHYROXINE SODIUM 25 MCG PO TABS: 25.0000 ug | ORAL_TABLET | Freq: Every day | ORAL | 3 refills | Status: DC

## 2021-12-13 NOTE — Progress Notes (Signed)
Annual Exam  ? ?Chief Complaint:  ?Chief Complaint  ?Patient presents with  ? Annual Exam  ? GI Problem  ?  Off and on stomach aches after meals  ? ? ?History of Present Illness:  ?[name] is a 46 y.o. G1P1 who LMP was Patient's last menstrual period was 12/01/2021 (exact date)., presents today for her annual examination.   ? ?#Stomach pain ?- b/l lower abdomen  ?- loose stool ?- on and off for years but it improved ?- no urgency or incontinence ?- use to occur with her periods ?- happening more often ?- improves with BM ?- has not done a food diary ?- did notice with brussel sprouts ? ?Nutrition ?Diet: vegetarian, does eat spicy foods ?Exercise: not currently ?She does get adequate calcium and Vitamin D in her diet. ? ? ?Social History  ? ?Tobacco Use  ?Smoking Status Never  ?Smokeless Tobacco Never  ? ?Social History  ? ?Substance and Sexual Activity  ?Alcohol Use No  ? Alcohol/week: 0.0 standard drinks  ? ?Social History  ? ?Substance and Sexual Activity  ?Drug Use No  ? ? ?Safety ?The patient wears seatbelts: yes.     ?The patient feels safe at home and in their relationships: yes. ? ?General Health ?Dentist in the last year: Yes ?Eye doctor: yes ? ?Menstrual ?Her menses are every 28-29 days, no concerns ? ?GYN ?She is not sexually active.  ? ? ?Cervical Cancer Screening:   ?Last Pap:   October 2021 Results were: no abnormalities /neg HPV DNA  ? ? ?Breast Cancer Screening ?There is no FH of breast cancer. There is no FH of ovarian cancer. BRCA screening Not Indicated.  ?Discussed that for average risk women between age 79-49 screening may reduce the risk of breast cancer death, however, at a lower rate than those over age 52. And that the the false-positive rates resulting in unnecessary biopsies with more screening is higher. The balance of benefits vs harms likely improves as you progress through your 40s. The patient does want a mammogram this year.  ? ?Colon Cancer Screening:  ?Age 82-75 yo -  benefits outweigh the risk. Adults 23-85 yo who have never been screened benefit.  ?Benefits: 134000 people in 2016 will be diagnosed and 49,000 will die - early detection helps ?Harms: Complications 2/2 to colonoscopy ?High Risk (Colonoscopy): genetic disorder (Lynch syndrome or familial adenomatous polyposis), personal hx of IBD, previous adenomatous polyp, or previous colorectal cancer, FamHx start 10 years before the age at diagnosis, increased in males and black race ? ?Options:  ?FIT - looks for hemoglobin (blood in the stool) - specific and fairly sensitive - must be done annually ?Cologuard - looks for DNA and blood - more sensitive - therefore can have more false positives, every 3 years ?Colonoscopy - every 10 years if normal - sedation, bowl prep, must have someone drive you ? ?Shared decision making and the patient had decided to do colonoscopy 2028. ? ?Weight ?Wt Readings from Last 3 Encounters:  ?12/13/21 124 lb 8 oz (56.5 kg)  ?07/14/20 135 lb (61.2 kg)  ?06/28/20 136 lb 4 oz (61.8 kg)  ? ?Patient has normal BMI  ?BMI Readings from Last 1 Encounters:  ?12/13/21 20.40 kg/m?  ? ? ? ?Chronic disease screening ?Blood pressure monitoring:  ?BP Readings from Last 3 Encounters:  ?12/13/21 100/70  ?07/14/20 128/86  ?06/28/20 110/78  ? ? ?Lipid Monitoring: Indication for screening: age >18, obesity, diabetes, family hx, CV risk factors.  ?  Lipid screening: Yes ? ?Lab Results  ?Component Value Date  ? CHOL 131 09/04/2019  ? HDL 52 09/04/2019  ? [place] 68 09/04/2019  ? TRIG 39 09/04/2019  ? CHOLHDL 2.5 09/04/2019  ? ? ? ?Diabetes Screening: age >63, overweight, family hx, PCOS, hx of gestational diabetes, at risk ethnicity ?Diabetes Screening screening: Yes ? ?No results found for: HGBA1C ? ? ?Past Medical History:  ?Diagnosis Date  ? Endometriosis   ? Herniated disc   ? Internal hemorrhoid 04/17/2018  ? Noted on colonoscopy Jan 2018  ? ? ?Past Surgical History:  ?Procedure Laterality Date  ? PELVIC LAPAROSCOPY     ? ? ?Prior to Admission medications   ?Medication Sig Start Date End Date Taking? Authorizing Provider  ?levothyroxine (SYNTHROID) 25 MCG tablet Take 1 tablet (25 mcg total) by mouth daily before breakfast. 06/28/20  Yes Lesleigh Noe, MD  ?Vitamin D, Cholecalciferol, 25 MCG (1000 UT) CAPS Take by mouth daily.   Yes [provider]  ? ? ?No Known Allergies ? ?Gynecologic History: Patient's last menstrual period was 12/01/2021 (exact date). ? ?Obstetric History: G1P1 ? ?Social History  ? ?Socioeconomic History  ? Marital status: Married  ?  Spouse name: [name]  ? Number of children: 1  ? Years of education: [employer]'s degree  ? Highest education level: Not on file  ?Occupational History  ?  Employer: [employer]  ?Tobacco Use  ? Smoking status: Never  ? Smokeless tobacco: Never  ?Vaping Use  ? Vaping Use: Never used  ?Substance and Sexual Activity  ? Alcohol use: No  ?  Alcohol/week: 0.0 standard drinks  ? Drug use: No  ? Sexual activity: Yes  ?  Partners: Male  ?  Birth control/protection: None  ?  Comment: 1st intercourse- 73 partners-  1, married- 49 yrs   ?Other Topics Concern  ? Not on file  ?Social History Narrative  ? 06/28/20  ? From: [place], in [place] since 2006  ? Living: with husband, [name] (2001) -   ? Work: Estate agent at [employer]  ?   ? Family: Anushka (2009), parents in [place]  ?   ? Enjoys: Quarry manager, Engineer, site, Corporate treasurer  ?   ? Exercise: walk daily - 20-25 minutes  ? Diet: ok - eats when she is hungry  ?   ? Safety  ? Seat belts: Yes   ? Guns: No  ? Safe in relationships: Yes   ? ?Social Determinants of Health  ? ?Financial Resource Strain: Not on file  ?Food Insecurity: Not on file  ?Transportation Needs: Not on file  ?Physical Activity: Not on file  ?Stress: Not on file  ?Social Connections: Not on file  ?Intimate Partner Violence: Not on file  ? ? ?Family History  ?Problem Relation Age of Onset  ? Thyroid disease Mother   ? Diabetes Mother   ? Depression Mother   ? Diabetes Father   ?  Cancer Maternal Aunt 77  ?     unknown  ? Diabetes Maternal Grandmother   ? Heart disease Maternal Grandmother   ? Heart disease Maternal Grandfather   ? Breast cancer Cousin   ? ? ?Review of Systems  ?Constitutional:  Negative for chills and fever.  ?HENT:  Negative for congestion and sore throat.   ?Eyes:  Negative for blurred vision and double vision.  ?Respiratory:  Negative for shortness of breath.   ?Cardiovascular:  Negative for chest pain.  ?Gastrointestinal:  Positive for abdominal pain, diarrhea and  heartburn. Negative for nausea and vomiting.  ?Genitourinary: Negative.   ?Musculoskeletal: Negative.  Negative for myalgias.  ?Skin:  Negative for rash.  ?Neurological:  Positive for headaches. Negative for dizziness.  ?Endo/Heme/Allergies:  Does not bruise/bleed easily.  ?Psychiatric/Behavioral:  Negative for depression. The patient is not nervous/anxious.    ? ?Physical Exam ?BP 100/70   Pulse 72   Temp 97.7 ?F (36.5 ?C) (Oral)   Ht 5' 5.5" (1.664 m)   Wt 124 lb 8 oz (56.5 kg)   LMP 12/01/2021 (Exact Date)   SpO2 100%   BMI 20.40 kg/m?   ? ?BP Readings from Last 3 Encounters:  ?12/13/21 100/70  ?07/14/20 128/86  ?06/28/20 110/78  ? ? ? ? ?Physical Exam ?Constitutional:   ?   General: She is not in acute distress. ?   Appearance: She is well-developed. She is not diaphoretic.  ?HENT:  ?   Head: Normocephalic and atraumatic.  ?   Right Ear: External ear normal.  ?   Left Ear: External ear normal.  ?   Nose: Nose normal.  ?Eyes:  ?   General: No scleral icterus. ?   Extraocular Movements: Extraocular movements intact.  ?   Conjunctiva/sclera: Conjunctivae normal.  ?Cardiovascular:  ?   Rate and Rhythm: Normal rate and regular rhythm.  ?   Heart sounds: No murmur heard. ?Pulmonary:  ?   Effort: Pulmonary effort is normal. No respiratory distress.  ?   Breath sounds: Normal breath sounds. No wheezing.  ?Abdominal:  ?   General: Bowel sounds are increased. There is no distension.  ?   Palpations: Abdomen is  soft. There is no mass.  ?   Tenderness: There is no abdominal tenderness. There is no guarding or rebound.  ?Musculoskeletal:     ?   General: Normal range of motion.  ?   Cervical back: Neck supple.  ?Lymphadenopathy

## 2021-12-13 NOTE — Assessment & Plan Note (Signed)
Has had some weight loss. Will recheck TSH. Cont levothyroxine.  ?

## 2021-12-13 NOTE — Patient Instructions (Signed)
Low-FODMAP Eating Plan °FODMAP stands for fermentable oligosaccharides, disaccharides, monosaccharides, and polyols. These are sugars that are hard for some people to digest. A low-FODMAP eating plan may help some people who have irritable bowel syndrome (IBS) and certain other bowel (intestinal) diseases to manage their symptoms. °This meal plan can be complicated to follow. Work with a diet and nutrition specialist (dietitian) to make a low-FODMAP eating plan that is right for you. A dietitian can help make sure that you get enough nutrition from this diet. °What are tips for following this plan? °Reading food labels °Check labels for hidden FODMAPs such as: °High-fructose syrup. °Honey. °Agave. °Natural fruit flavors. °Onion or garlic powder. °Choose low-FODMAP foods that contain 3-4 grams of fiber per serving. °Check food labels for serving sizes. Eat only one serving at a time to make sure FODMAP levels stay low. °Shopping °Shop with a list of foods that are recommended on this diet and make a meal plan. °Meal planning °Follow a low-FODMAP eating plan for up to 6 weeks, or as told by your health care provider or dietitian. °To follow the eating plan: °Eliminate high-FODMAP foods from your diet completely. Choose only low-FODMAP foods to eat. You will do this for 2-6 weeks. °Gradually reintroduce high-FODMAP foods into your diet one at a time. Most people should wait a few days before introducing the next new high-FODMAP food into their meal plan. Your dietitian can recommend how quickly you may reintroduce foods. °Keep a daily record of what and how much you eat and drink. Make note of any symptoms that you have after eating. °Review your daily record with a dietitian regularly to identify which foods you can eat and which foods you should avoid. °General tips °Drink enough fluid each day to keep your urine pale yellow. °Avoid processed foods. These often have added sugar and may be high in FODMAPs. °Avoid most  dairy products, whole grains, and sweeteners. °Work with a dietitian to make sure you get enough fiber in your diet. °Avoid high FODMAP foods at meals to manage symptoms. °Recommended foods °Fruits °Bananas, oranges, tangerines, lemons, limes, blueberries, raspberries, strawberries, grapes, cantaloupe, honeydew melon, kiwi, papaya, passion fruit, and pineapple. Limited amounts of dried cranberries, banana chips, and shredded coconut. °Vegetables °Eggplant, zucchini, cucumber, peppers, green beans, bean sprouts, lettuce, arugula, kale, Swiss chard, spinach, collard greens, bok choy, summer squash, potato, and tomato. Limited amounts of corn, carrot, and sweet potato. Green parts of scallions. °Grains °Gluten-free grains, such as rice, oats, buckwheat, quinoa, corn, polenta, and millet. Gluten-free pasta, bread, or cereal. Rice noodles. Corn tortillas. °Meats and other proteins °Unseasoned beef, pork, poultry, or fish. Eggs. Bacon. Tofu (firm) and tempeh. Limited amounts of nuts and seeds, such as almonds, walnuts, brazil nuts, pecans, peanuts, nut butters, pumpkin seeds, chia seeds, and sunflower seeds. °Dairy °Lactose-free milk, yogurt, and kefir. Lactose-free cottage cheese and ice cream. Non-dairy milks, such as almond, coconut, hemp, and rice milk. Non-dairy yogurt. Limited amounts of goat cheese, brie, mozzarella, parmesan, swiss, and other hard cheeses. °Fats and oils °Butter-free spreads. Vegetable oils, such as olive, canola, and sunflower oil. °Seasoning and other foods °Artificial sweeteners with names that do not end in "ol," such as aspartame, saccharine, and stevia. Maple syrup, white table sugar, raw sugar, brown sugar, and molasses. Mayonnaise, soy sauce, and tamari. Fresh basil, coriander, parsley, rosemary, and thyme. °Beverages °Water and mineral water. Sugar-sweetened soft drinks. Small amounts of orange juice or cranberry juice. Black and green tea. Most dry wines. Coffee. °  The items listed above  may not be a complete list of foods and beverages you can eat. Contact a dietitian for more information. °Foods to avoid °Fruits °Fresh, dried, and juiced forms of apple, pear, watermelon, peach, plum, cherries, apricots, blackberries, boysenberries, figs, nectarines, and mango. Avocado. °Vegetables °Chicory root, artichoke, asparagus, cabbage, snow peas, Brussels sprouts, broccoli, sugar snap peas, mushrooms, celery, and cauliflower. Onions, garlic, leeks, and the white part of scallions. °Grains °Wheat, including kamut, durum, and semolina. Barley and bulgur. Couscous. Wheat-based cereals. Wheat noodles, bread, crackers, and pastries. °Meats and other proteins °Fried or fatty meat. Sausage. Cashews and pistachios. Soybeans, baked beans, black beans, chickpeas, kidney beans, fava beans, navy beans, lentils, black-eyed peas, and split peas. °Dairy °Milk, yogurt, ice cream, and soft cheese. Cream and sour cream. Milk-based sauces. Custard. Buttermilk. Soy milk. °Seasoning and other foods °Any sugar-free gum or candy. Foods that contain artificial sweeteners such as sorbitol, mannitol, isomalt, or xylitol. Foods that contain honey, high-fructose corn syrup, or agave. Bouillon, vegetable stock, beef stock, and chicken stock. Garlic and onion powder. Condiments made with onion, such as hummus, chutney, pickles, relish, salad dressing, and salsa. Tomato paste. °Beverages °Chicory-based drinks. Coffee substitutes. Chamomile tea. Fennel tea. Sweet or fortified wines such as port or sherry. Diet soft drinks made with isomalt, mannitol, maltitol, sorbitol, or xylitol. Apple, pear, and mango juice. Juices with high-fructose corn syrup. °The items listed above may not be a complete list of foods and beverages you should avoid. Contact a dietitian for more information. °Summary °FODMAP stands for fermentable oligosaccharides, disaccharides, monosaccharides, and polyols. These are sugars that are hard for some people to  digest. °A low-FODMAP eating plan is a short-term diet that helps to ease symptoms of certain bowel diseases. °The eating plan usually lasts up to 6 weeks. After that, high-FODMAP foods are reintroduced gradually and one at a time. This can help you find out which foods may be causing symptoms. °A low-FODMAP eating plan can be complicated. It is best to work with a dietitian who has experience with this type of plan. °This information is not intended to replace advice given to you by your health care provider. Make sure you discuss any questions you have with your health care provider. °Document Revised: 01/21/2020 Document Reviewed: 01/21/2020 °Elsevier Patient Education © 2022 [employer] Inc[employer] ° °

## 2021-12-13 NOTE — Assessment & Plan Note (Signed)
Suspect this is the cause of her abdominal pain. Low Fod-MAP diet and food diary advised. Normal colonoscopy 2018. Labs today. GI if not improving ?

## 2021-12-14 ENCOUNTER — Other Ambulatory Visit: Payer: Self-pay | Admitting: Family Medicine

## 2021-12-14 DIAGNOSIS — E559 Vitamin D deficiency, unspecified: Secondary | ICD-10-CM

## 2021-12-14 MED ORDER — CHOLECALCIFEROL 1.25 MG (50000 UT) PO TABS: 1.0000 | ORAL_TABLET | ORAL | 1 refills | Status: DC

## 2021-12-15 LAB — HEPATITIS C ANTIBODY
Hepatitis C Ab: NONREACTIVE
SIGNAL TO CUT-OFF: 0.1 (ref <1.00)

## 2021-12-15 LAB — HIV ANTIBODY (ROUTINE TESTING W REFLEX): HIV 1&2 Ab, 4th Generation: NONREACTIVE

## 2021-12-15 LAB — FERRITIN: Ferritin: 3 ng/mL — ABNORMAL LOW (ref 16–232)

## 2021-12-19 ENCOUNTER — Encounter: Payer: Self-pay | Admitting: Family Medicine

## 2021-12-19 DIAGNOSIS — Z1321 Encounter for screening for nutritional disorder: Secondary | ICD-10-CM

## 2022-01-10 ENCOUNTER — Ambulatory Visit (INDEPENDENT_AMBULATORY_CARE_PROVIDER_SITE_OTHER): Payer: BC Managed Care – PPO | Admitting: Obstetrics & Gynecology

## 2022-01-10 ENCOUNTER — Encounter: Payer: Self-pay | Admitting: Obstetrics & Gynecology

## 2022-01-10 VITALS — BP 112/78 | HR 64 | Resp 16 | Ht 65.0 in | Wt 125.0 lb

## 2022-01-10 DIAGNOSIS — F525 Vaginismus not due to a substance or known physiological condition: Secondary | ICD-10-CM

## 2022-01-10 DIAGNOSIS — R1030 Lower abdominal pain, unspecified: Secondary | ICD-10-CM

## 2022-01-10 DIAGNOSIS — Z01419 Encounter for gynecological examination (general) (routine) without abnormal findings: Secondary | ICD-10-CM

## 2022-01-10 NOTE — Progress Notes (Signed)
? ? ?[name] 1976/03/14 [id number] ? ? ?History:    46 y.o. G1P1L1 Married.  Daughter is 107z yo. ?  ?RP:  Established patient presenting for Annual Gyn visit ?  ?HPI:  Menses regular normal every month.  No BTB.  Intermittent pelvic pain/discomfort.  Usually feels better after a bowel movement.  Abstinent because of long h/o pain with IC/vaginismus. Pap 06/2020 Neg.  No h/o abnormal Pap. Repeat Pap at 3 yrs. Normal vaginal secretions. No UTI Sx, BMs wnl.  Breasts normal. Screening mammo Neg 11/2021.  BMI 20.8.  Good fitness and healthy nutrition.  Colono 09/2016. ? ? ?Past medical history,surgical history, family history and social history were all reviewed and documented in the EPIC chart. ? ?Gynecologic History ?Patient's last menstrual period was 12/29/2021 (exact date). ? ?Obstetric History ?OB History  ?Gravida Para Term Preterm AB Living  ?'1 1       1  '$ ?SAB IAB Ectopic Multiple Live Births  ?        1  ?  ?# Outcome Date GA Lbr Len/2nd Weight Sex Delivery Anes PTL Lv  ?1 Para           ? ? ? ?ROS: A ROS was performed and pertinent positives and negatives are included in the history. ? GENERAL: No fevers or chills. HEENT: No change in vision, no earache, sore throat or sinus congestion. NECK: No pain or stiffness. CARDIOVASCULAR: No chest pain or pressure. No palpitations. PULMONARY: No shortness of breath, cough or wheeze. GASTROINTESTINAL: No abdominal pain, nausea, vomiting or diarrhea, melena or bright red blood per rectum. GENITOURINARY: No urinary frequency, urgency, hesitancy or dysuria. MUSCULOSKELETAL: No joint or muscle pain, no back pain, no recent trauma. DERMATOLOGIC: No rash, no itching, no lesions. ENDOCRINE: No polyuria, polydipsia, no heat or cold intolerance. No recent change in weight. HEMATOLOGICAL: No anemia or easy bruising or bleeding. NEUROLOGIC: No headache, seizures, numbness, tingling or weakness. PSYCHIATRIC: No depression, no loss of interest in normal activity or change in sleep  pattern.  ?  ? ?Exam: ? ? ?BP 112/78   Pulse 64   Resp 16   Ht '5\' 5"'$  (1.651 m)   Wt 125 lb (56.7 kg)   LMP 12/29/2021 (Exact Date)   BMI 20.80 kg/m?  ? ?Body mass index is 20.8 kg/m?. ? ?General appearance : Well developed well nourished female. No acute distress ?HEENT: Eyes: no retinal hemorrhage or exudates,  Neck supple, trachea midline, no carotid bruits, no thyroidmegaly ?Lungs: Clear to auscultation, no rhonchi or wheezes, or rib retractions  ?Heart: Regular rate and rhythm, no murmurs or gallops ?Breast:Examined in sitting and supine position were symmetrical in appearance, no palpable masses or tenderness,  no skin retraction, no nipple inversion, no nipple discharge, no skin discoloration, no axillary or supraclavicular lymphadenopathy ?Abdomen: no palpable masses or tenderness, no rebound or guarding ?Extremities: no edema or skin discoloration or tenderness ? ?Pelvic: Vulva: Normal ?            Vagina: No gross lesions or discharge ? Cervix: No gross lesions or discharge ? Uterus  AV, normal size, shape and consistency, non-tender and mobile ? Adnexa  Without masses or tenderness ? Anus: Normal ? ? ?Assessment/Plan:  46 y.o. female for annual exam  ? ?1. Well female exam with routine gynecological exam ?Menses regular normal every month.  No BTB.  Intermittent pelvic pain/discomfort.  Usually feels better after a bowel movement.  Abstinent because of long h/o pain with IC/vaginismus. Pap  06/2020 Neg.  No h/o abnormal Pap. Repeat Pap at 3 yrs. Normal vaginal secretions. No UTI Sx, BMs wnl.  Breasts normal. Screening mammo Neg 11/2021.  BMI 20.8.  Good fitness and healthy nutrition.  Colono 09/2016. ? ?2. Functional vaginismus ?Abstinent because of long h/o pain with IC/vaginismus. ? ?3. Intermittent lower abdominal pain ?Intermittent pelvic pain/discomfort.  Usually feels better after a bowel movement.  Probably of intestinal origin.  Will see her Gastro.  F/U Pelvic US to r/o Gyn pathology. ?- US  Transvaginal Non-OB; Future ? ?Other orders ?- Cholecalciferol (VITAMIN D3) 1.25 MG (50000 UT) CAPS; Take 1 capsule by mouth once a week. ?- Ferrous Sulfate (IRON PO); Take by mouth.  ? ?Princess Bruins MD, 9:33 AM 01/10/2022 ? ?  ?

## 2022-02-22 ENCOUNTER — Other Ambulatory Visit: Payer: BC Managed Care – PPO | Admitting: Obstetrics & Gynecology

## 2022-02-22 ENCOUNTER — Other Ambulatory Visit: Payer: BC Managed Care – PPO

## 2022-04-10 ENCOUNTER — Encounter: Payer: Self-pay | Admitting: Obstetrics & Gynecology

## 2022-04-10 ENCOUNTER — Ambulatory Visit (INDEPENDENT_AMBULATORY_CARE_PROVIDER_SITE_OTHER): Payer: BC Managed Care – PPO

## 2022-04-10 ENCOUNTER — Ambulatory Visit (INDEPENDENT_AMBULATORY_CARE_PROVIDER_SITE_OTHER): Payer: BC Managed Care – PPO | Admitting: Obstetrics & Gynecology

## 2022-04-10 VITALS — BP 102/70 | HR 84

## 2022-04-10 DIAGNOSIS — R1030 Lower abdominal pain, unspecified: Secondary | ICD-10-CM

## 2022-04-10 NOTE — Progress Notes (Signed)
    Audray Rumore April 24, 1976 [id number]        46 y.o.  G1P1L1 Married.  Daughter 43+ yo.  RP: Intermittent pelvic discomfort for Pelvic US  HPI: Menses regular normal every month.  No BTB.  Intermittent pelvic pain/discomfort, most of the time at mid cycle.  Usually feels better after a bowel movement.  Abstinent because of long h/o pain with IC/vaginismus.  No UTI Sx, BMs wnl.    OB History  Gravida Para Term Preterm AB Living  '1 1 1     1  '$ SAB IAB Ectopic Multiple Live Births          1    # Outcome Date GA Lbr Len/2nd Weight Sex Delivery Anes PTL Lv  1 Term             Past medical history,surgical history, problem list, medications, allergies, family history and social history were all reviewed and documented in the EPIC chart.   Directed ROS with pertinent positives and negatives documented in the history of present illness/assessment and plan.  Exam:  Vitals:   04/10/22 1358  BP: 102/70  Pulse: 84  SpO2: 99%   General appearance:  Normal  Pelvic US today: Comparison is made with previous scan April 16, 2018 (old machine).  T/V images.  Anteverted uterus normal in size and shape measured at 7.72 x 5.49 x 4.72 cm.  Previously seen fibroids are unchanged, with 2 additional small ones seen today.  Symmetrical sec. endometrial lining measured at 4.2 mm with no obvious mass or thickening or abnormal blood flow seen.  Left ovary is small with normal follicular pattern.  The right ovary shows a 3.8 x 3.3 cm avascular cystic structure with debris.  Nontender to palpation.  Probably a hemorrhagic corpus luteum on day 20 of the cycle.  No free fluid in the pelvis.   Assessment/Plan:  46 y.o. G1P1001   1. Intermittent lower abdominal pain Menses regular normal every month.  No BTB.  Intermittent pelvic pain/discomfort, most of the time at mid cycle.  Usually feels better after a bowel movement.  Abstinent because of long h/o pain with IC/vaginismus.  No UTI Sx, BMs wnl.  Pelvic US  findings reviewed with patient.  Right ovary shows a 3.8 cm avascular cystic structure compatible with a hemorrhagic corpus luteum.  Patient's midcycle discomfort symptoms are probably due to ovulation.  Reassured.  Will use ibuprofen as needed at ovulation time.  Other orders - Multiple Vitamin (MULTIVITAMIN PO); Take by mouth.   Princess Bruins MD, 2:11 PM 04/10/2022

## 2022-12-27 ENCOUNTER — Telehealth: Payer: Self-pay

## 2022-12-27 NOTE — Telephone Encounter (Signed)
Patient called to make an appointment with a provider here at [facility] [place] [facility].  Patient's husband was on the phone and prefers to see an MD and doesn't understand why he or his wife would need a transfer of care for a "getting to know you visit"  Advised patient of other offices that have MD's who are accepting new patients, which is the patient's preference

## 2023-01-31 ENCOUNTER — Ambulatory Visit (INDEPENDENT_AMBULATORY_CARE_PROVIDER_SITE_OTHER): Payer: BC Managed Care – PPO | Admitting: Obstetrics & Gynecology

## 2023-01-31 ENCOUNTER — Encounter: Payer: Self-pay | Admitting: Obstetrics & Gynecology

## 2023-01-31 ENCOUNTER — Other Ambulatory Visit (HOSPITAL_COMMUNITY)
Admission: RE | Admit: 2023-01-31 | Discharge: 2023-01-31 | Disposition: A | Discharge status: Home / Self Care | Payer: BC Managed Care – PPO | Source: Ambulatory Visit | Attending: Obstetrics & Gynecology | Admitting: Obstetrics & Gynecology

## 2023-01-31 VITALS — BP 104/68 | HR 88 | Ht 64.75 in | Wt 120.0 lb

## 2023-01-31 DIAGNOSIS — R1030 Lower abdominal pain, unspecified: Secondary | ICD-10-CM | POA: Diagnosis not present

## 2023-01-31 DIAGNOSIS — Z01419 Encounter for gynecological examination (general) (routine) without abnormal findings: Secondary | ICD-10-CM | POA: Diagnosis not present

## 2023-01-31 DIAGNOSIS — F525 Vaginismus not due to a substance or known physiological condition: Secondary | ICD-10-CM | POA: Diagnosis not present

## 2023-01-31 NOTE — Progress Notes (Signed)
[name] 1976/03/06 [id number]   History:    47 y.o.  G1P1L1 Married.  Daughter is 13 yo.   RP:  Established patient presenting for Annual Gyn visit   HPI:  Menses regular normal every month.  No BTB.  Intermittent Rt pelvic pain/discomfort. Abstinent because of long h/o pain with IC/vaginismus. Pap 06/2020 Neg.  No h/o abnormal Pap. Pap reflex today.  Normal vaginal secretions. No UTI Sx, BMs wnl.  Breasts normal. Screening mammo Neg 11/2021.  Schedule mammo now.  BMI 20.12. Good fitness and healthy nutrition. Colono 09/2016.   Pelvic US 04/10/22:  Comparison is made with previous scan April 16, 2018 (old machine).  T/V images.  Anteverted uterus normal in size and shape measured at 7.72 x 5.49 x 4.72 cm.  Previously seen fibroids are unchanged, with 2 additional small ones seen today.  Symmetrical sec. endometrial lining measured at 4.2 mm with no obvious mass or thickening or abnormal blood flow seen.  Left ovary is small with normal follicular pattern.  The right ovary shows a 3.8 x 3.3 cm avascular cystic structure with debris.  Nontender to palpation.  Probably a hemorrhagic corpus luteum on day 20 of the cycle.  No free fluid in the pelvis.    Past medical history,surgical history, family history and social history were all reviewed and documented in the EPIC chart.  Gynecologic History Patient's last menstrual period was 01/13/2023.  Obstetric History OB History  Gravida Para Term Preterm AB Living  1 1 1     1   SAB IAB Ectopic Multiple Live Births          1    # Outcome Date GA Lbr Len/2nd Weight Sex Delivery Anes PTL Lv  1 Term              ROS: A ROS was performed and pertinent positives and negatives are included in the history. GENERAL: No fevers or chills. HEENT: No change in vision, no earache, sore throat or sinus congestion. NECK: No pain or stiffness. CARDIOVASCULAR: No chest pain or pressure. No palpitations. PULMONARY: No shortness of breath, cough or wheeze.  GASTROINTESTINAL: No abdominal pain, nausea, vomiting or diarrhea, melena or bright red blood per rectum. GENITOURINARY: No urinary frequency, urgency, hesitancy or dysuria. MUSCULOSKELETAL: No joint or muscle pain, no back pain, no recent trauma. DERMATOLOGIC: No rash, no itching, no lesions. ENDOCRINE: No polyuria, polydipsia, no heat or cold intolerance. No recent change in weight. HEMATOLOGICAL: No anemia or easy bruising or bleeding. NEUROLOGIC: No headache, seizures, numbness, tingling or weakness. PSYCHIATRIC: No depression, no loss of interest in normal activity or change in sleep pattern.     Exam:   BP 104/68   Pulse 88   Ht 5' 4.75" (1.645 m)   Wt 120 lb (54.4 kg)   LMP 01/13/2023 Comment: not sexually active  SpO2 97%   BMI 20.12 kg/m   Body mass index is 20.12 kg/m.  General appearance : Well developed well nourished female. No acute distress HEENT: Eyes: no retinal hemorrhage or exudates,  Neck supple, trachea midline, no carotid bruits, no thyroidmegaly Lungs: Clear to auscultation, no rhonchi or wheezes, or rib retractions  Heart: Regular rate and rhythm, no murmurs or gallops Breast:Examined in sitting and supine position were symmetrical in appearance, no palpable masses or tenderness,  no skin retraction, no nipple inversion, no nipple discharge, no skin discoloration, no axillary or supraclavicular lymphadenopathy Abdomen: no palpable masses or tenderness, no rebound or guarding Extremities: no  edema or skin discoloration or tenderness  Pelvic: Vulva: Normal             Vagina: No gross lesions or discharge  Cervix: No gross lesions or discharge.  Pap reflex done.  Uterus  AV, normal size, shape and consistency, non-tender and mobile  Adnexa  Without masses or tenderness  Anus: Normal   Assessment/Plan:  47 y.o. female for annual exam   1. Encounter for routine gynecological examination with Papanicolaou smear of cervix Menses regular normal every month.  No  BTB.  Intermittent Rt pelvic pain/discomfort.  Abstinent because of long h/o pain with IC/vaginismus. Pap 06/2020 Neg.  No h/o abnormal Pap. Pap reflex today.  Normal vaginal secretions. No UTI Sx, BMs wnl. Breasts normal. Screening mammo Neg 11/2021.  Schedule mammo now.  BMI 20.12. Good fitness and healthy nutrition. Colono 09/2016. - Cytology - PAP( [name])  2. Functional vaginismus Sexually active, but no IC.  No need for contraception.  3. Intermittent right lower abdominal pain  Intermittent Rt pelvic pain/discomfort. Pelvic US 03/2022 showed a 3.8 cm Rt ovarian functional cyst.  Will repeat a Pelvic US to reevaluate. - US Transvaginal Non-OB; Future   Genia Del MD, 3:07 PM

## 2023-02-05 LAB — CYTOLOGY - PAP: Diagnosis: NEGATIVE

## 2023-02-21 ENCOUNTER — Other Ambulatory Visit: Payer: BC Managed Care – PPO

## 2023-02-21 ENCOUNTER — Other Ambulatory Visit: Payer: BC Managed Care – PPO | Admitting: Obstetrics & Gynecology

## 2023-02-28 ENCOUNTER — Ambulatory Visit (INDEPENDENT_AMBULATORY_CARE_PROVIDER_SITE_OTHER): Payer: BC Managed Care – PPO | Admitting: Family Medicine

## 2023-02-28 ENCOUNTER — Encounter: Payer: Self-pay | Admitting: Family Medicine

## 2023-02-28 VITALS — BP 110/84 | HR 80 | Temp 98.4°F | Ht 64.75 in | Wt 117.0 lb

## 2023-02-28 DIAGNOSIS — D709 Neutropenia, unspecified: Secondary | ICD-10-CM | POA: Diagnosis not present

## 2023-02-28 DIAGNOSIS — E039 Hypothyroidism, unspecified: Secondary | ICD-10-CM | POA: Diagnosis not present

## 2023-02-28 DIAGNOSIS — E559 Vitamin D deficiency, unspecified: Secondary | ICD-10-CM

## 2023-02-28 DIAGNOSIS — E538 Deficiency of other specified B group vitamins: Secondary | ICD-10-CM | POA: Diagnosis not present

## 2023-02-28 DIAGNOSIS — R7309 Other abnormal glucose: Secondary | ICD-10-CM

## 2023-02-28 DIAGNOSIS — D1803 Hemangioma of intra-abdominal structures: Secondary | ICD-10-CM

## 2023-02-28 DIAGNOSIS — Z13220 Encounter for screening for lipoid disorders: Secondary | ICD-10-CM

## 2023-02-28 MED ORDER — LEVOTHYROXINE SODIUM 25 MCG PO TABS: 25.0000 ug | ORAL_TABLET | Freq: Every day | ORAL | 3 refills | Status: DC

## 2023-02-28 NOTE — Patient Instructions (Addendum)
It was a pleasure meeting you today. Thank you for allowing me to take part in your health care.  Our goals for today as we discussed include:  We will get some labs today.  If they are abnormal or we need to do something about them, I will call you.  If they are normal, I will send you a message on MyChart (if it is active) or a letter in the mail.  If you don't hear from [place] in 2 weeks, please call the office at the number below.   Refill sent for levothyroxine  Schedule appointment for fasting blood work.  Follow up as needed and in 1 year for annual physical   If you have any questions or concerns, please do not hesitate to call the office at [phone].  I look forward to our next visit and until then take care and stay safe.  Regards,   Dana Allan, MD   [facility]

## 2023-02-28 NOTE — Progress Notes (Signed)
   SUBJECTIVE:  No chief complaint on file.  HPI Patient presents to clinic to transfer care  No acute concerns today  Denies any SI/HI  Hypothyroid Asymptomatic.  Takes Levothyroxine 25 mcg daily. Last TSH 4.15 from 2023.  Requesting refill of medication.   PERTINENT PMH / PSH: Hypothyroid  OBJECTIVE:  BP 110/84   Pulse 80   Temp 98.4 F (36.9 C) (Oral)   Ht 5' 4.75" (1.645 m)   Wt 117 lb (53.1 kg)   LMP 01/13/2023 Comment: not sexually active  SpO2 99%   BMI 19.62 kg/m    Physical Exam Vitals reviewed.  Constitutional:      General: She is not in acute distress.    Appearance: She is not ill-appearing.  HENT:     Head: Normocephalic.     Right Ear: Tympanic membrane, ear canal and external ear normal.     Left Ear: Tympanic membrane, ear canal and external ear normal.     Nose: Nose normal.  Eyes:     Conjunctiva/sclera: Conjunctivae normal.  Neck:     Thyroid: No thyromegaly or thyroid tenderness.  Cardiovascular:     Rate and Rhythm: Normal rate and regular rhythm.     Heart sounds: Normal heart sounds.  Pulmonary:     Effort: Pulmonary effort is normal.     Breath sounds: Normal breath sounds.  Abdominal:     General: Abdomen is flat. Bowel sounds are normal.     Palpations: Abdomen is soft.  Musculoskeletal:        General: Normal range of motion.     Cervical back: Normal range of motion.  Neurological:     Mental Status: She is alert and oriented to person, place, and time. Mental status is at baseline.  Psychiatric:        Mood and Affect: Mood normal.        Behavior: Behavior normal.        Thought Content: Thought content normal.        Judgment: Judgment normal.     ASSESSMENT/PLAN:  Hypothyroidism, unspecified type Assessment & Plan: Chronic. Asymptomatic,  tolerating medication Check TSH Refill Levothyroxine 25 mcg daily  Orders: -     Levothyroxine Sodium; Take 1 tablet (25 mcg total) by mouth daily before breakfast.  Dispense:  90 tablet; Refill: 3 -     TSH; Future  Vitamin D deficiency Assessment & Plan: Check Vitamin D level  Orders: -     VITAMIN D 25 Hydroxy (Vit-D Deficiency, Fractures); Future  Neutropenia, unspecified type [facility]) Assessment & Plan: Chronic. Asymptomatic Check CBC    Vitamin B 12 deficiency Assessment & Plan: Check Vitamin B 12 level  Orders: -     Vitamin B12; Future  Liver hemangioma Assessment & Plan: Stable on previous imaging.  No indication for continued follow up imaging unless symptomatic  Orders: -     Comprehensive metabolic panel; Future  Abnormal glucose -     Hemoglobin A1c; Future  Lipid screening -     Lipid panel; Future  HCM Last PAP 05/24.  NILM.  Follows with OBGYN Colonoscopy up to date.  Due 09/2026 Mammogram due.  Patient to schedule.  Follows with OBGYN Hepatitis C/HIV screening completed. Tetanus up to date  PDMP reviewed  Return if symptoms worsen or fail to improve, for annual visit with fasting labs 1 week prior.  Dana Allan, MD

## 2023-03-19 DIAGNOSIS — R634 Abnormal weight loss: Secondary | ICD-10-CM | POA: Insufficient documentation

## 2023-03-22 ENCOUNTER — Encounter: Payer: Self-pay | Admitting: Family Medicine

## 2023-03-22 ENCOUNTER — Other Ambulatory Visit (INDEPENDENT_AMBULATORY_CARE_PROVIDER_SITE_OTHER): Payer: BC Managed Care – PPO

## 2023-03-22 DIAGNOSIS — E559 Vitamin D deficiency, unspecified: Secondary | ICD-10-CM

## 2023-03-22 DIAGNOSIS — R7309 Other abnormal glucose: Secondary | ICD-10-CM | POA: Diagnosis not present

## 2023-03-22 DIAGNOSIS — E538 Deficiency of other specified B group vitamins: Secondary | ICD-10-CM

## 2023-03-22 DIAGNOSIS — D1803 Hemangioma of intra-abdominal structures: Secondary | ICD-10-CM | POA: Diagnosis not present

## 2023-03-22 DIAGNOSIS — E039 Hypothyroidism, unspecified: Secondary | ICD-10-CM | POA: Diagnosis not present

## 2023-03-22 DIAGNOSIS — Z13220 Encounter for screening for lipoid disorders: Secondary | ICD-10-CM

## 2023-03-22 LAB — COMPREHENSIVE METABOLIC PANEL
ALT: 13 U/L (ref 0–35)
AST: 19 U/L (ref 0–37)
Albumin: 3.9 g/dL (ref 3.5–5.2)
Alkaline Phosphatase: 35 U/L — ABNORMAL LOW (ref 39–117)
BUN: 12 mg/dL (ref 6–23)
CO2: 28 mEq/L (ref 19–32)
Calcium: 9.1 mg/dL (ref 8.4–10.5)
Chloride: 105 mEq/L (ref 96–112)
Creatinine, Ser: 0.69 mg/dL (ref 0.40–1.20)
GFR: 103.91 mL/min (ref >60.00)
Glucose, Bld: 91 mg/dL (ref 70–99)
Potassium: 4.2 mEq/L (ref 3.5–5.1)
Sodium: 138 mEq/L (ref 135–145)
Total Bilirubin: 0.8 mg/dL (ref 0.2–1.2)
Total Protein: 6.6 g/dL (ref 6.0–8.3)

## 2023-03-22 LAB — LIPID PANEL
Cholesterol: 118 mg/dL (ref 0–200)
HDL: 51.6 mg/dL (ref >39.00)
LDL Cholesterol: 58 mg/dL (ref 0–99)
NonHDL: 66.4
Total CHOL/HDL Ratio: 2
Triglycerides: 40 mg/dL (ref 0.0–149.0)
VLDL: 8 mg/dL (ref 0.0–40.0)

## 2023-03-22 LAB — VITAMIN B12: Vitamin B-12: 95 pg/mL — ABNORMAL LOW (ref 211–911)

## 2023-03-22 LAB — HEMOGLOBIN A1C: Hgb A1c MFr Bld: 5.4 % (ref 4.6–6.5)

## 2023-03-22 LAB — TSH: TSH: 3.39 u[IU]/mL (ref 0.35–5.50)

## 2023-03-22 LAB — VITAMIN D 25 HYDROXY (VIT D DEFICIENCY, FRACTURES): VITD: 16.82 ng/mL — ABNORMAL LOW (ref 30.00–100.00)

## 2023-03-22 NOTE — Assessment & Plan Note (Signed)
Check Vitamin D level 

## 2023-03-22 NOTE — Assessment & Plan Note (Signed)
Stable on previous imaging.  No indication for continued follow up imaging unless symptomatic

## 2023-03-22 NOTE — Assessment & Plan Note (Signed)
Chronic.  Asymptomatic. Check CBC  

## 2023-03-22 NOTE — Assessment & Plan Note (Signed)
Check Vitamin B 12 level 

## 2023-03-22 NOTE — Assessment & Plan Note (Signed)
Chronic. Asymptomatic,  tolerating medication Check TSH Refill Levothyroxine 25 mcg daily

## 2023-03-28 ENCOUNTER — Other Ambulatory Visit: Payer: BC Managed Care – PPO

## 2023-03-28 ENCOUNTER — Ambulatory Visit (INDEPENDENT_AMBULATORY_CARE_PROVIDER_SITE_OTHER): Payer: BC Managed Care – PPO

## 2023-03-28 ENCOUNTER — Telehealth: Payer: Self-pay | Admitting: Family Medicine

## 2023-03-28 ENCOUNTER — Encounter: Payer: Self-pay | Admitting: Obstetrics & Gynecology

## 2023-03-28 ENCOUNTER — Ambulatory Visit (INDEPENDENT_AMBULATORY_CARE_PROVIDER_SITE_OTHER): Payer: BC Managed Care – PPO | Admitting: Obstetrics & Gynecology

## 2023-03-28 VITALS — BP 106/68 | HR 68 | Resp 16

## 2023-03-28 DIAGNOSIS — R1030 Lower abdominal pain, unspecified: Secondary | ICD-10-CM

## 2023-03-28 NOTE — Progress Notes (Signed)
    [name] 1976/03/27 [id number]        47 y.o.  G1P1001   RP: RLQ pain for Pelvic US  HPI: Intermittent RLQ pain.  No current pelvic pain.  H/O Rt simple ovarian cyst at 3.8 cm on Pelvic US 03/2022.  LMP normal 03/09/23.  Abstinent.   OB History  Gravida Para Term Preterm AB Living  1 1 1     1   SAB IAB Ectopic Multiple Live Births          1    # Outcome Date GA Lbr Len/2nd Weight Sex Type Anes PTL Lv  1 Term             Past medical history,surgical history, problem list, medications, allergies, family history and social history were all reviewed and documented in the EPIC chart.   Directed ROS with pertinent positives and negatives documented in the history of present illness/assessment and plan.  Exam:  Vitals:   03/28/23 1353  BP: 106/68  Pulse: 68  Resp: 16   General appearance:  Normal  Pelvic US today: Comparison is made with previous scan April 11, 2023.  T/V images.  Anteverted uterus with no change in size since the previous scan.  No significant change in previously seen fibroids, except for 3 small additional ones noted today.  The overall uterine size is measured at 7.68 x 6.78 x 4.49 cm.  The endometrial lining is thin and symmetrical with no mass or thickening seen.  The endometrial lining is measured at 4.79 mm.  Right ovary normal in size with normal follicular pattern and normal perfusion.  A 1.4 cm resolving corpus luteum cyst is present on the right ovary on cycle day #20.  Previously seen cyst with debris on the right ovary is no longer present.  Left ovary is small with 2 normal-appearing follicles.  No adnexal mass.  No free fluid in the pelvis.   Assessment/Plan:  47 y.o. G1P1001   1. Intermittent right lower abdominal pain  Intermittent RLQ pain.  No current pelvic pain.  H/O Rt simple ovarian cyst at 3.8 cm on Pelvic US 03/2022.  LMP normal 03/09/23.  Abstinent. Pelvic US findings thoroughly reviewed with patient.  Rt and Lt ovaries normal.  No  adnexal mass.  Stable small uterine fibroids.  Normal endometrial line.  Patient reassured.  Genia Del MD, 2:04 PM 03/28/2023

## 2023-03-28 NOTE — Telephone Encounter (Signed)
Pt notified that Dr. Clent Ridges is out of the office this week & we will call as soon as results are available. Pt also wants to know why the CBC was not drwan with annual labs.

## 2023-03-28 NOTE — Telephone Encounter (Signed)
Pt called in looking for her lab results from 03/22/23.

## 2023-03-29 ENCOUNTER — Telehealth: Payer: BC Managed Care – PPO | Admitting: Obstetrics & Gynecology

## 2023-03-30 ENCOUNTER — Other Ambulatory Visit: Payer: Self-pay | Admitting: Family Medicine

## 2023-03-30 ENCOUNTER — Encounter: Payer: Self-pay | Admitting: Family Medicine

## 2023-03-30 DIAGNOSIS — E559 Vitamin D deficiency, unspecified: Secondary | ICD-10-CM

## 2023-03-30 DIAGNOSIS — E538 Deficiency of other specified B group vitamins: Secondary | ICD-10-CM

## 2023-03-30 MED ORDER — VITAMIN D3 1.25 MG (50000 UT) PO CAPS: 1.0000 | ORAL_CAPSULE | ORAL | 1 refills | Status: DC

## 2023-03-30 MED ORDER — VITAMIN B-12 1000 MCG SL SUBL
2000.0000 ug | SUBLINGUAL_TABLET | Freq: Every day | SUBLINGUAL | 3 refills | Status: AC
Start: 2023-03-30 — End: ?

## 2023-04-03 NOTE — Telephone Encounter (Signed)
Patient asked this question in the original message sent last week as well. (Regarding CBC)  It was never answered.

## 2023-06-24 ENCOUNTER — Encounter: Payer: Self-pay | Admitting: Obstetrics & Gynecology

## 2023-06-24 DIAGNOSIS — Z1231 Encounter for screening mammogram for malignant neoplasm of breast: Secondary | ICD-10-CM

## 2023-06-25 ENCOUNTER — Other Ambulatory Visit: Payer: Self-pay | Admitting: Family Medicine

## 2023-06-25 DIAGNOSIS — Z1231 Encounter for screening mammogram for malignant neoplasm of breast: Secondary | ICD-10-CM

## 2023-07-11 ENCOUNTER — Ambulatory Visit
Admission: RE | Admit: 2023-07-11 | Discharge: 2023-07-11 | Disposition: A | Discharge status: Home / Self Care | Payer: BC Managed Care – PPO | Source: Ambulatory Visit | Attending: Family Medicine | Admitting: Family Medicine

## 2023-07-11 DIAGNOSIS — Z1231 Encounter for screening mammogram for malignant neoplasm of breast: Secondary | ICD-10-CM

## 2023-07-28 ENCOUNTER — Encounter: Payer: Self-pay | Admitting: *Deleted

## 2023-07-28 ENCOUNTER — Ambulatory Visit
Admission: EM | Admit: 2023-07-28 | Discharge: 2023-07-28 | Disposition: A | Discharge status: Home / Self Care | Payer: BC Managed Care – PPO | Attending: Emergency Medicine | Admitting: Emergency Medicine

## 2023-07-28 DIAGNOSIS — H00011 Hordeolum externum right upper eyelid: Secondary | ICD-10-CM | POA: Diagnosis not present

## 2023-07-28 DIAGNOSIS — B029 Zoster without complications: Secondary | ICD-10-CM

## 2023-07-28 MED ORDER — VALACYCLOVIR HCL 1 G PO TABS: 1000.0000 mg | ORAL_TABLET | Freq: Three times a day (TID) | ORAL | 0 refills | Status: AC

## 2023-07-28 MED ORDER — ERYTHROMYCIN 5 MG/GM OP OINT: TOPICAL_OINTMENT | OPHTHALMIC | 0 refills | Status: DC

## 2023-07-28 NOTE — Discharge Instructions (Addendum)
Today you are evaluated for your rash which is consistent with herpes zoster commonly known as shingles, this rash is a viral process that causes pain and itching following the nerves of 1 side of the body  As you have lesions over your eye please schedule follow-up appointment with the ophthalmologist to ensure no underlying damage done to the nerves of the eye  You are contagious until rash has completely scabbed over, on exam has already begun to do so  Avoid directly touching and if you do so please wash hands  Begin valacyclovir every 8 hours to help reduce the amount of virus in your body which ideally will help rash to clear faster  As you have a possible stye on the eye apply erythromycin ointment into the water line every morning and every evening for 7 days to prevent any clear any bacteria that is contributing  May follow-up with urgent care or your primary doctor for any further concerns

## 2023-07-28 NOTE — ED Triage Notes (Signed)
Patient states face rash right side over eye that is painful and blistering, stye to eye after rash started and spreading to eyelid.  No fever

## 2023-07-28 NOTE — ED Provider Notes (Signed)
[name]    CSN: [id number] Arrival date & time: 07/28/23  2355      History   Chief Complaint Chief Complaint  Patient presents with   Rash    HPI [name] is a 47 y.o. female.   Presents for evaluation of a stye to the right upper eyelid present for 7 days began to have a painful and blistering rash over the right side of the forehead and the right eye beginning 1 day ago.  Concern for possible shingles.  Has not attempted treatment.  Denies visual changes or light sensitivity, injury or trauma to the eye.  No known sick contacts prior.  Denies fever or drainage.  Past Medical History:  Diagnosis Date   Anemia    Endometriosis    Herniated disc    Hypothyroid    Internal hemorrhoid 04/17/2018   Noted on colonoscopy Jan 2018    Patient Active Problem List   Diagnosis Date Noted   Vitamin B 12 deficiency 03/22/2023   Vitamin D deficiency 04/22/2018   Neutropenia (HCC) 04/17/2018   Internal hemorrhoid 04/17/2018   IBS (irritable bowel syndrome) 03/06/2018   Hypothyroidism 11/14/2016   Endometriosis 09/02/2014   Liver hemangioma 10/03/2011   RUQ abdominal pain 08/20/2011    Past Surgical History:  Procedure Laterality Date   PELVIC LAPAROSCOPY      OB History     Gravida  1   Para  1   Term  1   Preterm      AB      Living  1      SAB      IAB      Ectopic      Multiple      Live Births  1            Home Medications    Prior to Admission medications   Medication Sig Start Date End Date Taking? Authorizing Provider  erythromycin ophthalmic ointment Place a 1/2 inch ribbon of ointment into the lower eyelid twice a day for 7 days 07/28/23  Yes Dalilah Curlin, Elita Boone, NP  valACYclovir (VALTREX) 1000 MG tablet Take 1 tablet (1,000 mg total) by mouth 3 (three) times daily for 7 days. 07/28/23 08/04/23 Yes Tanji Storrs R, NP  Cholecalciferol (VITAMIN D3) 1.25 MG (50000 UT) CAPS Take 1 capsule (1.25 mg total) by mouth once a  week. 03/30/23   Dana Allan, MD  Cyanocobalamin (VITAMIN B-12) 1000 MCG SUBL Place 2 tablets (2,000 mcg total) under the tongue daily. 03/30/23   Dana Allan, MD  levothyroxine (SYNTHROID) 25 MCG tablet Take 1 tablet (25 mcg total) by mouth daily before breakfast. 02/28/23   Dana Allan, MD    Family History Family History  Problem Relation Age of Onset   Thyroid disease Mother    Diabetes Mother    Depression Mother    Diabetes Father    Cancer Maternal Aunt 28       unknown   Stomach cancer Maternal Aunt    Diabetes Maternal Grandmother    Heart disease Maternal Grandmother    Heart disease Maternal Grandfather    Breast cancer Cousin 16    Social History Social History   Tobacco Use   Smoking status: Never   Smokeless tobacco: Never  Vaping Use   Vaping status: Never Used  Substance Use Topics   Alcohol use: No    Alcohol/week: 0.0 standard drinks of alcohol   Drug use: No  Allergies   Patient has no known allergies.   Review of Systems Review of Systems  Skin:  Positive for rash.     Physical Exam Triage Vital Signs ED Triage Vitals  Encounter Vitals Group     BP 07/28/23 0955 130/85     Systolic BP Percentile --      Diastolic BP Percentile --      Pulse Rate 07/28/23 0955 72     Resp 07/28/23 0955 16     Temp 07/28/23 0955 99.1 F (37.3 C)     Temp Source 07/28/23 0955 Oral     SpO2 07/28/23 0955 99 %     Weight 07/28/23 0953 120 lb (54.4 kg)     Height 07/28/23 0953 5' 5.5" (1.664 m)     Head Circumference --      Peak Flow --      Pain Score 07/28/23 0953 3     Pain Loc --      Pain Education --      Exclude from Growth Chart --    No data found.  Updated Vital Signs BP 130/85 (BP Location: Left Arm)   Pulse 72   Temp 99.1 F (37.3 C) (Oral)   Resp 16   Ht 5' 5.5" (1.664 m)   Wt 120 lb (54.4 kg)   LMP 07/20/2023 (Exact Date)   SpO2 99%   BMI 19.67 kg/m   Visual Acuity Right Eye Distance:   Left Eye Distance:   Bilateral  Distance:    Right Eye Near:   Left Eye Near:    Bilateral Near:     Physical Exam Constitutional:      Appearance: Normal appearance.  HENT:     Head:     Comments: Clusters of erythematous blistering present to the right side of the forehead Eyes:     Comments: Borderline external to the right upper eyelid, scattered clusters of erythematous blisters over the right eyelid, vision is grossly intact, extraocular movements intact, no drainage noted on exam  Pulmonary:     Effort: Pulmonary effort is normal.  Neurological:     Mental Status: She is alert and oriented to person, place, and time. Mental status is at baseline.      UC Treatments / Results  Labs (all labs ordered are listed, but only abnormal results are displayed) Labs Reviewed - No data to display  EKG   Radiology No results found.  Procedures Procedures (including critical care time)  Medications Ordered in UC Medications - No data to display  Initial Impression / Assessment and Plan / UC Course  I have reviewed the triage vital signs and the nursing notes.  Pertinent labs & imaging results that were available during my care of the patient were reviewed by me and considered in my medical decision making (see chart for details).  Herpes zoster without complication, hordeolum externum of the right eyelid  Presentation is consistent with above diagnosis, discussed findings with patient, prescribed erythromycin ointment and valacyclovir for management, discussed contagion and ways to prevent spread, as rash is affecting the eyelid given walking referral to ophthalmology for follow-up exam, will defer emergency department visit as she has had no visual changes at this time, stable for outpatient management, given written handout on shingles and discussed vaccination after symptoms have resolved, may follow-up with urgent care as needed Final Clinical Impressions(s) / UC Diagnoses   Final diagnoses:  Herpes  zoster without complication     Discharge Instructions  Today you are evaluated for your rash which is consistent with herpes zoster commonly known as shingles, this rash is a viral process that causes pain and itching following the nerves of 1 side of the body  As you have lesions over your eye please schedule follow-up appointment with the ophthalmologist to ensure no underlying damage done to the nerves of the eye  You are contagious until rash has completely scabbed over, on exam has already begun to do so  Avoid directly touching and if you do so please wash hands  Begin valacyclovir every 8 hours to help reduce the amount of virus in your body which ideally will help rash to clear faster  As you have a possible stye on the eye apply erythromycin ointment into the water line every morning and every evening for 7 days to prevent any clear any bacteria that is contributing  May follow-up with urgent care or your primary doctor for any further concerns    ED Prescriptions     Medication Sig Dispense Auth. Provider   valACYclovir (VALTREX) 1000 MG tablet Take 1 tablet (1,000 mg total) by mouth 3 (three) times daily for 7 days. 21 tablet Filomeno Cromley R, NP   erythromycin ophthalmic ointment Place a 1/2 inch ribbon of ointment into the lower eyelid twice a day for 7 days 3.5 g Valinda Hoar, NP      PDMP not reviewed this encounter.   Valinda Hoar, NP 07/28/23 1043

## 2024-01-14 ENCOUNTER — Encounter: Payer: Self-pay | Admitting: Family Medicine

## 2024-01-14 ENCOUNTER — Telehealth: Payer: Self-pay

## 2024-01-14 NOTE — Telephone Encounter (Signed)
 I left a voicemail for patient and sent her a letter via MyChart letting her know that Dr. Valli Gaw has had a schedule change, so we need to reschedule her 02/24/2024 visit.  E2C2 - when patient calls back, please help her reschedule this visit.

## 2024-02-20 ENCOUNTER — Ambulatory Visit (INDEPENDENT_AMBULATORY_CARE_PROVIDER_SITE_OTHER): Admitting: Family Medicine

## 2024-02-20 ENCOUNTER — Encounter: Payer: Self-pay | Admitting: Family Medicine

## 2024-02-20 VITALS — BP 118/64 | HR 73 | Temp 97.9°F | Resp 20 | Ht 65.0 in | Wt 114.1 lb

## 2024-02-20 DIAGNOSIS — D649 Anemia, unspecified: Secondary | ICD-10-CM | POA: Diagnosis not present

## 2024-02-20 DIAGNOSIS — Z1231 Encounter for screening mammogram for malignant neoplasm of breast: Secondary | ICD-10-CM

## 2024-02-20 DIAGNOSIS — E538 Deficiency of other specified B group vitamins: Secondary | ICD-10-CM | POA: Diagnosis not present

## 2024-02-20 DIAGNOSIS — E039 Hypothyroidism, unspecified: Secondary | ICD-10-CM

## 2024-02-20 DIAGNOSIS — D709 Neutropenia, unspecified: Secondary | ICD-10-CM

## 2024-02-20 DIAGNOSIS — E559 Vitamin D deficiency, unspecified: Secondary | ICD-10-CM | POA: Diagnosis not present

## 2024-02-20 DIAGNOSIS — R109 Unspecified abdominal pain: Secondary | ICD-10-CM | POA: Insufficient documentation

## 2024-02-20 DIAGNOSIS — R1031 Right lower quadrant pain: Secondary | ICD-10-CM | POA: Insufficient documentation

## 2024-02-20 DIAGNOSIS — R194 Change in bowel habit: Secondary | ICD-10-CM | POA: Insufficient documentation

## 2024-02-20 DIAGNOSIS — Z0000 Encounter for general adult medical examination without abnormal findings: Secondary | ICD-10-CM

## 2024-02-20 DIAGNOSIS — Z1211 Encounter for screening for malignant neoplasm of colon: Secondary | ICD-10-CM | POA: Insufficient documentation

## 2024-02-20 DIAGNOSIS — K5900 Constipation, unspecified: Secondary | ICD-10-CM | POA: Insufficient documentation

## 2024-02-20 DIAGNOSIS — R140 Abdominal distension (gaseous): Secondary | ICD-10-CM | POA: Insufficient documentation

## 2024-02-20 DIAGNOSIS — R141 Gas pain: Secondary | ICD-10-CM | POA: Insufficient documentation

## 2024-02-20 DIAGNOSIS — R634 Abnormal weight loss: Secondary | ICD-10-CM

## 2024-02-20 DIAGNOSIS — R1011 Right upper quadrant pain: Secondary | ICD-10-CM | POA: Insufficient documentation

## 2024-02-20 NOTE — Progress Notes (Signed)
 SUBJECTIVE:   Chief Complaint  Patient presents with   Annual Exam   Weight Loss   HPI Presents to clinic for annual visit  Discussed the use of AI scribe software for clinical note transcription with the patient, who gave verbal consent to proceed.  History of Present Illness [name] is a 48 year old female who presents with unexplained weight loss.  She has experienced a weight loss of approximately 6 pounds over the past year, with her weight decreasing from 126 pounds to 114 pounds. Her clothes have become loose, and she has gone from a size 8 to a size 4. She is concerned about the weight loss, as it has been noticed by others, and she feels weaker with body aches. She prefers her weight to be between 125 and 130 pounds to feel strong.  She has a history of endometriosis and previously underwent surgery for this condition. Her menstrual cycles are regular but last longer with spotting before the actual period starts. She is currently on her period, which she expects to end in two days.  Her dietary intake includes two meals a day, often skipping breakfast due to a busy schedule. She experiences gastrointestinal issues, including constipation and an upset stomach, which affect her eating habits. Her husband works in [place], contributing to her busier schedule, which may affect her weight.  She has a history of thyroid  issues and has been on thyroid  medication for several years, although she stopped taking it a week ago due to travel. She takes vitamin B12 over the counter and has been inconsistent with her vitamin D  intake. She has experienced symptoms that are opposite to what her thyroid  condition would suggest, such as weight loss despite being hypothyroid.  No chest pain, nausea, vomiting, or blood in urine or stool. She has a history of shingles affecting her eye a few months ago and notes her eyesight is worsening.     PERTINENT PMH / PSH: As above  OBJECTIVE:  BP  118/64   Pulse 73   Temp 97.9 F (36.6 C)   Resp 20   Ht 5\' 5"  (1.651 m)   Wt 114 lb 2 oz (51.8 kg)   LMP 02/17/2024 (Exact Date)   SpO2 98%   BMI 18.99 kg/m    Physical Exam Vitals reviewed.  Constitutional:      General: She is not in acute distress.    Appearance: She is not ill-appearing.  HENT:     Head: Normocephalic.     Right Ear: Tympanic membrane, ear canal and external ear normal.     Left Ear: Tympanic membrane, ear canal and external ear normal.     Nose: Nose normal.     Mouth/Throat:     Mouth: Mucous membranes are moist.  Eyes:     Extraocular Movements: Extraocular movements intact.     Conjunctiva/sclera: Conjunctivae normal.     Pupils: Pupils are equal, round, and reactive to light.  Neck:     Thyroid : No thyromegaly or thyroid  tenderness.     Vascular: No carotid bruit.  Cardiovascular:     Rate and Rhythm: Normal rate and regular rhythm.     Pulses: Normal pulses.     Heart sounds: Normal heart sounds.  Pulmonary:     Effort: Pulmonary effort is normal.     Breath sounds: Normal breath sounds.  Abdominal:     General: Bowel sounds are normal. There is no distension.     Palpations: Abdomen  is soft.     Tenderness: There is no abdominal tenderness. There is no right CVA tenderness, left CVA tenderness, guarding or rebound.  Musculoskeletal:        General: Normal range of motion.     Cervical back: Normal range of motion.     Right lower leg: No edema.     Left lower leg: No edema.  Lymphadenopathy:     Cervical: No cervical adenopathy.  Skin:    Capillary Refill: Capillary refill takes less than 2 seconds.  Neurological:     General: No focal deficit present.     Mental Status: She is alert and oriented to person, place, and time. Mental status is at baseline.     Motor: No weakness.  Psychiatric:        Mood and Affect: Mood normal.        Behavior: Behavior normal.        Thought Content: Thought content normal.        Judgment:  Judgment normal.           02/20/2024    1:00 PM 02/28/2023    3:22 PM 12/13/2021    9:43 AM 06/28/2020    4:14 PM 09/02/2019    8:40 AM  Depression screen PHQ 2/9  Decreased Interest 0 0 0 0 0  Down, Depressed, Hopeless 0 0 0 0 0  PHQ - 2 Score 0 0 0 0 0  Altered sleeping 0    0  Tired, decreased energy 1    0  Change in appetite 0    0  Feeling bad or failure about yourself  0    0  Trouble concentrating 0    0  Moving slowly or fidgety/restless 0    0  Suicidal thoughts 0    0  PHQ-9 Score 1    0  Difficult doing work/chores Not difficult at all    Not difficult at all      02/20/2024    1:00 PM  GAD 7 : Generalized Anxiety Score  Nervous, Anxious, on Edge 0  Control/stop worrying 0  Worry too much - different things 1  Trouble relaxing 0  Restless 0  Easily annoyed or irritable 0  Afraid - awful might happen 0  Total GAD 7 Score 1  Anxiety Difficulty Not difficult at all    ASSESSMENT/PLAN:  Annual physical exam Assessment & Plan: Mammogram due.  Referral sent Recommend regular self breast exams Cervical cancer screening up to date. Follows with OB Depression screening negative Colonoscopy up to date Annual labs today Vaccines up to date    Vitamin D  deficiency -     VITAMIN D  25 Hydroxy (Vit-D Deficiency, Fractures); Future  Vitamin B 12 deficiency -     Vitamin B12; Future -     Intrinsic Factor Antibodies; Future  Hypothyroidism, unspecified type Assessment & Plan: Chronic. Has missed 1 week of medication due to travel Check TSH Refill Levothyroxine  25 mcg daily.  May need adjustment pending lab results    Orders: -     TSH; Future -     Comprehensive metabolic panel with GFR; Future -     T4, free; Future -     Levothyroxine  Sodium; Take 1 tablet (25 mcg total) by mouth daily before breakfast.  Dispense: 90 tablet; Refill: 3  Normocytic anemia -     CBC with Differential/Platelet; Future -     IBC + Ferritin; Future  Breast cancer  screening by mammogram -     3D Screening Mammogram, Left and Right; Future  Abnormal weight loss Assessment & Plan: Chronic issue.  Previously evaluated in 2018 for similar symptoms.  Colonoscopy negative at that time.  Reports 6lb weight loss in 1 year.  Has decrease in size of clothing. Denies any nausea, vomiting, fevers, abdominal pain, change in bowels habits,  chest pain or palpitations. Endorses two meals daily and increased activity and stress. Will check labs today Primary screening up to date.  Consider repeat Colonoscopy give last completed prior to recommended screening age.     PDMP reviewed  Return if symptoms worsen or fail to improve, for PCP.  Valli Gaw, MD

## 2024-02-20 NOTE — Patient Instructions (Addendum)
 It was a pleasure meeting you today. Thank you for allowing me to take part in your health care.  Our goals for today as we discussed include:  Schedule appointment for fasting labs in 1 week Fast for 10 hours  Referral sent for Mammogram. Please call to schedule appointment. [facility] [address] [place], [place] [address] [phone]    OBGYN Providers  Dr Jeani Mill [address] [place], [place], [place] [address] Phone: [phone]  Dr Ivery Marking [address], [place], [place] [address] Phone: [phone]   Dr Jeneane Miracle [place] [address], [address] [place], [place] [address] [phone]  Karma Oz, [place] [address] [place], [place] [address] Phone: [phone]    This is a list of the screening recommended for you and due dates:  Health Maintenance  Topic Date Due   COVID-19 Vaccine (4 - 2024-25 season) 05/19/2023   Flu Shot  04/17/2024   Mammogram  07/10/2024   Pap with HPV screening  01/30/2026   Colon Cancer Screening  10/10/2026   DTaP/Tdap/Td vaccine (4 - Td or Tdap) 12/14/2031   Hepatitis C Screening  Completed   HIV Screening  Completed   HPV Vaccine  Aged Out   Meningitis B Vaccine  Aged Out     If you have any questions or concerns, please do not hesitate to call the office at [phone].  I look forward to our next visit and until then take care and stay safe.  Regards,   Valli Gaw, MD   [facility]

## 2024-02-23 ENCOUNTER — Encounter: Payer: Self-pay | Admitting: Family Medicine

## 2024-02-23 DIAGNOSIS — D649 Anemia, unspecified: Secondary | ICD-10-CM | POA: Insufficient documentation

## 2024-02-23 DIAGNOSIS — Z0000 Encounter for general adult medical examination without abnormal findings: Secondary | ICD-10-CM | POA: Insufficient documentation

## 2024-02-23 DIAGNOSIS — Z1231 Encounter for screening mammogram for malignant neoplasm of breast: Secondary | ICD-10-CM | POA: Insufficient documentation

## 2024-02-23 MED ORDER — LEVOTHYROXINE SODIUM 25 MCG PO TABS: 25.0000 ug | ORAL_TABLET | Freq: Every day | ORAL | 3 refills | Status: AC

## 2024-02-23 NOTE — Assessment & Plan Note (Addendum)
 Chronic. Has missed 1 week of medication due to travel Check TSH Refill Levothyroxine  25 mcg daily.  May need adjustment pending lab results

## 2024-02-23 NOTE — Assessment & Plan Note (Addendum)
 Mammogram due.  Referral sent Recommend regular self breast exams Cervical cancer screening up to date. Follows with OB Depression screening negative Colonoscopy up to date Annual labs today Vaccines up to date

## 2024-02-23 NOTE — Assessment & Plan Note (Signed)
 Chronic issue.  Previously evaluated in 2018 for similar symptoms.  Colonoscopy negative at that time.  Reports 6lb weight loss in 1 year.  Has decrease in size of clothing. Denies any nausea, vomiting, fevers, abdominal pain, change in bowels habits,  chest pain or palpitations. Endorses two meals daily and increased activity and stress. Will check labs today Primary screening up to date.  Consider repeat Colonoscopy give last completed prior to recommended screening age.

## 2024-02-24 ENCOUNTER — Encounter: Admitting: Family Medicine

## 2024-02-27 ENCOUNTER — Other Ambulatory Visit: Payer: Self-pay | Admitting: *Deleted

## 2024-02-27 ENCOUNTER — Other Ambulatory Visit (INDEPENDENT_AMBULATORY_CARE_PROVIDER_SITE_OTHER)

## 2024-02-27 DIAGNOSIS — D649 Anemia, unspecified: Secondary | ICD-10-CM | POA: Diagnosis not present

## 2024-02-27 DIAGNOSIS — E559 Vitamin D deficiency, unspecified: Secondary | ICD-10-CM

## 2024-02-27 DIAGNOSIS — E538 Deficiency of other specified B group vitamins: Secondary | ICD-10-CM

## 2024-02-27 DIAGNOSIS — E039 Hypothyroidism, unspecified: Secondary | ICD-10-CM

## 2024-02-27 NOTE — Addendum Note (Signed)
 Addended by: Lindle Rhea on: 02/27/2024 08:51 AM   Modules accepted: Orders

## 2024-02-28 ENCOUNTER — Ambulatory Visit: Payer: Self-pay | Admitting: Family Medicine

## 2024-02-28 ENCOUNTER — Other Ambulatory Visit: Payer: Self-pay | Admitting: Family Medicine

## 2024-02-28 DIAGNOSIS — E559 Vitamin D deficiency, unspecified: Secondary | ICD-10-CM

## 2024-02-28 DIAGNOSIS — E538 Deficiency of other specified B group vitamins: Secondary | ICD-10-CM

## 2024-02-28 DIAGNOSIS — E611 Iron deficiency: Secondary | ICD-10-CM

## 2024-02-28 LAB — IRON,TIBC AND FERRITIN PANEL
Ferritin: 6 ng/mL — ABNORMAL LOW (ref 15–150)
Iron Saturation: 6 % — CL (ref 15–55)
Iron: 23 ug/dL — ABNORMAL LOW (ref 27–159)
Total Iron Binding Capacity: 354 ug/dL (ref 250–450)
UIBC: 331 ug/dL (ref 131–425)

## 2024-02-28 LAB — CBC WITH DIFFERENTIAL/PLATELET
Basophils Absolute: 0 10*3/uL (ref 0.0–0.2)
Basos: 1 %
EOS (ABSOLUTE): 0.1 10*3/uL (ref 0.0–0.4)
Eos: 3 %
Hematocrit: 33.7 % — ABNORMAL LOW (ref 34.0–46.6)
Hemoglobin: 9.8 g/dL — ABNORMAL LOW (ref 11.1–15.9)
Immature Grans (Abs): 0 10*3/uL (ref 0.0–0.1)
Immature Granulocytes: 0 %
Lymphocytes Absolute: 1.8 10*3/uL (ref 0.7–3.1)
Lymphs: 47 %
MCH: 24.4 pg — ABNORMAL LOW (ref 26.6–33.0)
MCHC: 29.1 g/dL — ABNORMAL LOW (ref 31.5–35.7)
MCV: 84 fL (ref 79–97)
Monocytes Absolute: 0.3 10*3/uL (ref 0.1–0.9)
Monocytes: 9 %
Neutrophils Absolute: 1.5 10*3/uL (ref 1.4–7.0)
Neutrophils: 40 %
Platelets: 295 10*3/uL (ref 150–450)
RBC: 4.01 x10E6/uL (ref 3.77–5.28)
RDW: 14.1 % (ref 11.7–15.4)
WBC: 3.8 10*3/uL (ref 3.4–10.8)

## 2024-02-28 LAB — COMPREHENSIVE METABOLIC PANEL WITH GFR
ALT: 11 IU/L (ref 0–32)
AST: 16 IU/L (ref 0–40)
Albumin: 4.3 g/dL (ref 3.9–4.9)
Alkaline Phosphatase: 45 IU/L (ref 44–121)
BUN/Creatinine Ratio: 22 (ref 9–23)
BUN: 14 mg/dL (ref 6–24)
Bilirubin Total: 0.6 mg/dL (ref 0.0–1.2)
CO2: 23 mmol/L (ref 20–29)
Calcium: 9 mg/dL (ref 8.7–10.2)
Chloride: 103 mmol/L (ref 96–106)
Creatinine, Ser: 0.65 mg/dL (ref 0.57–1.00)
Globulin, Total: 2.5 g/dL (ref 1.5–4.5)
Glucose: 85 mg/dL (ref 70–99)
Potassium: 4.5 mmol/L (ref 3.5–5.2)
Sodium: 138 mmol/L (ref 134–144)
Total Protein: 6.8 g/dL (ref 6.0–8.5)
eGFR: 109 mL/min/{1.73_m2} (ref >59)

## 2024-02-28 LAB — T4, FREE: Free T4: 1.03 ng/dL (ref 0.82–1.77)

## 2024-02-28 LAB — TSH: TSH: 5.27 u[IU]/mL — ABNORMAL HIGH (ref 0.450–4.500)

## 2024-02-28 LAB — INTRINSIC FACTOR ANTIBODIES: Intrinsic Factor Abs, Serum: 1.1 [AU]/ml (ref 0.0–1.1)

## 2024-02-28 LAB — VITAMIN B12: Vitamin B-12: 367 pg/mL (ref 232–1245)

## 2024-02-28 LAB — VITAMIN D 25 HYDROXY (VIT D DEFICIENCY, FRACTURES): Vit D, 25-Hydroxy: 26.6 ng/mL — ABNORMAL LOW (ref 30.0–100.0)

## 2024-02-28 MED ORDER — VITAMIN D3 1.25 MG (50000 UT) PO CAPS: 1.0000 | ORAL_CAPSULE | ORAL | 1 refills | Status: DC

## 2024-02-28 MED ORDER — IRON (FERROUS SULFATE) 325 (65 FE) MG PO TABS: 325.0000 mg | ORAL_TABLET | Freq: Two times a day (BID) | ORAL | 3 refills | Status: AC

## 2024-03-02 NOTE — Telephone Encounter (Signed)
 Copied from CRM [id number]. Topic: Referral - Question >> Mar 02, 2024  1:55 PM Marlan Silva wrote: Reason for CRM: Patient called in returning CMA  [facility]' call. Patient can be reached at [phone].

## 2024-03-03 ENCOUNTER — Telehealth: Payer: Self-pay

## 2024-03-03 NOTE — Telephone Encounter (Signed)
Called and spoke to pt about lab results. 

## 2024-03-03 NOTE — Telephone Encounter (Signed)
 Copied from CRM [id number]. Topic: Appointments - Appointment Scheduling >> Mar 03, 2024  2:11 PM Turkey A wrote: Schedule patients lab for 04/10/24- read message from [place] to patient and patient said that she was confused on why would she need to see Hematology or GI. Patient would like for [facility] to call her

## 2024-03-05 ENCOUNTER — Other Ambulatory Visit: Payer: Self-pay | Admitting: Family Medicine

## 2024-03-05 DIAGNOSIS — Z13220 Encounter for screening for lipoid disorders: Secondary | ICD-10-CM

## 2024-03-05 DIAGNOSIS — E039 Hypothyroidism, unspecified: Secondary | ICD-10-CM

## 2024-04-10 ENCOUNTER — Other Ambulatory Visit

## 2024-10-01 ENCOUNTER — Ambulatory Visit: Payer: Self-pay

## 2024-10-01 NOTE — Telephone Encounter (Signed)
 FYI Only or Action Required?: FYI only for provider: appointment scheduled on 1/19.  Patient was last seen in primary care on 02/20/2024 by Hope Merle, MD.  Called Nurse Triage reporting Appointment.    Triage Disposition: Information or Advice Only Call  Patient/caregiver understands and will follow disposition?: Yes     Copied from CRM #[id number]. Topic: Clinical - Red Word Triage >> Oct 01, 2024  8:36 AM Adelita E wrote: [facility] that prompted transfer to Nurse Triage: Pain in neck that started yesterday. Rated pain 5-6 out of 10. Reason for Disposition  General information question, no triage required and triager able to answer question  Answer Assessment - Initial Assessment Questions 1. REASON FOR CALL: What is the main reason for your call? or How can I best help you?     Patients husband called in to cancel 1/19 appointment , and schedule an acute visit with Dr. Abbey, he was advised per Epic the soonest with that provider would be 2/18. He decided to keep existing 1/19 appointment. No further concerns at this time.  Protocols used: Information Only Call - No Triage-A-AH

## 2024-10-05 ENCOUNTER — Encounter: Payer: Self-pay | Admitting: Internal Medicine

## 2024-10-05 ENCOUNTER — Ambulatory Visit: Admitting: Internal Medicine

## 2024-10-05 VITALS — BP 118/76 | HR 72 | Temp 98.1°F | Ht 65.0 in | Wt 117.8 lb

## 2024-10-05 DIAGNOSIS — R634 Abnormal weight loss: Secondary | ICD-10-CM

## 2024-10-05 DIAGNOSIS — D509 Iron deficiency anemia, unspecified: Secondary | ICD-10-CM

## 2024-10-05 DIAGNOSIS — E039 Hypothyroidism, unspecified: Secondary | ICD-10-CM | POA: Diagnosis not present

## 2024-10-05 DIAGNOSIS — E559 Vitamin D deficiency, unspecified: Secondary | ICD-10-CM

## 2024-10-05 DIAGNOSIS — S161XXA Strain of muscle, fascia and tendon at neck level, initial encounter: Secondary | ICD-10-CM

## 2024-10-05 DIAGNOSIS — E538 Deficiency of other specified B group vitamins: Secondary | ICD-10-CM | POA: Diagnosis not present

## 2024-10-05 LAB — CBC WITH DIFFERENTIAL/PLATELET
Basophils Absolute: 0 K/uL (ref 0.0–0.1)
Basophils Relative: 0.6 % (ref 0.0–3.0)
Eosinophils Absolute: 0.1 K/uL (ref 0.0–0.7)
Eosinophils Relative: 2.6 % (ref 0.0–5.0)
HCT: 39.1 % (ref 36.0–46.0)
Hemoglobin: 13 g/dL (ref 12.0–15.0)
Lymphocytes Relative: 29 % (ref 12.0–46.0)
Lymphs Abs: 1.3 K/uL (ref 0.7–4.0)
MCHC: 33.3 g/dL (ref 30.0–36.0)
MCV: 86 fl (ref 78.0–100.0)
Monocytes Absolute: 0.3 K/uL (ref 0.1–1.0)
Monocytes Relative: 7.8 % (ref 3.0–12.0)
Neutro Abs: 2.6 K/uL (ref 1.4–7.7)
Neutrophils Relative %: 60 % (ref 43.0–77.0)
Platelets: 214 K/uL (ref 150.0–400.0)
RBC: 4.54 Mil/uL (ref 3.87–5.11)
RDW: 13.1 % (ref 11.5–15.5)
WBC: 4.4 K/uL (ref 4.0–10.5)

## 2024-10-05 LAB — VITAMIN D 25 HYDROXY (VIT D DEFICIENCY, FRACTURES): VITD: 26.59 ng/mL — ABNORMAL LOW (ref 30.00–100.00)

## 2024-10-05 LAB — COMPREHENSIVE METABOLIC PANEL WITH GFR
ALT: 11 U/L (ref 3–35)
AST: 14 U/L (ref 5–37)
Albumin: 4.3 g/dL (ref 3.5–5.2)
Alkaline Phosphatase: 36 U/L — ABNORMAL LOW (ref 39–117)
BUN: 17 mg/dL (ref 6–23)
CO2: 27 meq/L (ref 19–32)
Calcium: 9.2 mg/dL (ref 8.4–10.5)
Chloride: 105 meq/L (ref 96–112)
Creatinine, Ser: 0.67 mg/dL (ref 0.40–1.20)
GFR: 103.52 mL/min
Glucose, Bld: 88 mg/dL (ref 70–99)
Potassium: 4.3 meq/L (ref 3.5–5.1)
Sodium: 137 meq/L (ref 135–145)
Total Bilirubin: 0.6 mg/dL (ref 0.2–1.2)
Total Protein: 7 g/dL (ref 6.0–8.3)

## 2024-10-05 LAB — IBC + FERRITIN
Ferritin: 19.5 ng/mL (ref 10.0–291.0)
Iron: 35 ug/dL — ABNORMAL LOW (ref 42–145)
Saturation Ratios: 11 % — ABNORMAL LOW (ref 20.0–50.0)
TIBC: 319.2 ug/dL (ref 250.0–450.0)
Transferrin: 228 mg/dL (ref 212.0–360.0)

## 2024-10-05 LAB — LIPID PANEL
Cholesterol: 122 mg/dL (ref 28–200)
HDL: 46.1 mg/dL
LDL Cholesterol: 68 mg/dL (ref 10–99)
NonHDL: 75.46
Total CHOL/HDL Ratio: 3
Triglycerides: 35 mg/dL (ref 10.0–149.0)
VLDL: 7 mg/dL (ref 0.0–40.0)

## 2024-10-05 LAB — TSH: TSH: 3.16 u[IU]/mL (ref 0.35–5.50)

## 2024-10-05 LAB — VITAMIN B12: Vitamin B-12: 944 pg/mL — ABNORMAL HIGH (ref 211–911)

## 2024-10-05 MED ORDER — METHOCARBAMOL 500 MG PO TABS: 500.0000 mg | ORAL_TABLET | Freq: Three times a day (TID) | ORAL | 0 refills | Status: AC | PRN

## 2024-10-05 NOTE — Patient Instructions (Signed)
" °  VISIT SUMMARY: During your visit, we discussed your iron  deficiency anemia, neck pain, menstrual abnormalities, thyroid  disorder, and nutritional supplementation. We reviewed your symptoms, current treatments, and made adjustments to your care plan.  YOUR PLAN: -CERVICAL AND UPPER BACK MYOFASCIAL PAIN: This is muscle pain in your neck and upper back, likely due to muscle strain. We recommend using Motrin as needed for pain, applying muscle rub, and avoiding reading your phone in bed. A muscle relaxant has been prescribed for nighttime use. Please return if the pain worsens or new symptoms develop.  -IRON  DEFICIENCY ANEMIA: This condition means you have low iron  levels, likely due to heavier menstrual periods. We will check your blood counts and refer you to a gastroenterologist to evaluate for potential gastrointestinal bleeding. Follow up with a gynecologist for your menstrual irregularities.  -HYPOTHYROIDISM: This condition means your thyroid  is underactive. Your thyroid  function is slightly abnormal, possibly due to inconsistent timing of your medication. We will check your thyroid  function and advise you to take your medication on an empty stomach, at least 30 minutes before eating or drinking.  -VITAMIN D  DEFICIENCY: This condition means you have low levels of vitamin D . We will assess your current status with a vitamin D  level test.  -VITAMIN B12 DEFICIENCY: This condition means you have low levels of vitamin B12. We will assess your current status with a vitamin B12 level test.  INSTRUCTIONS: Please follow up with the blood tests for your anemia, thyroid  function, vitamin D , and vitamin B12 levels. Additionally, schedule appointments with a gastroenterologist for potential gastrointestinal bleeding and a gynecologist for menstrual irregularities. Return if your neck pain worsens or if you develop new symptoms.    Contains text generated by Abridge.   "

## 2024-10-05 NOTE — Assessment & Plan Note (Signed)
-   Patient complains of pain over the back of her head that radiates into her neck and upper back for the last week -Pain occurs when patient moves her head from a certain direction or when she attempts to lie down to go to sleep in a certain position.  -Patient has used Motrin in the last couple of days with good relief of her pain - On exam, patient some mild tightness in the muscles of her right upper back and neck.  No tenderness to palpation, no rash -I suspect pain is likely secondary to muscle strain -Will treat the patient with Robaxin  to use as needed.  Patient cautioned on side effects including drowsiness -Continue with Motrin as needed as well as over-the-counter muscle rub like Tiger balm or Biofreeze -Patient instructed to follow-up with us  if her symptoms persist or worsen -No further workup at this time

## 2024-10-05 NOTE — Assessment & Plan Note (Signed)
-   This problem is chronic and stable -Last vitamin D  level was low and she was prescribed vitamin D  supplementation -She has completed course of vitamin D  supplementation -Will recheck her vitamin D  levels today -No further workup at this time

## 2024-10-05 NOTE — Progress Notes (Signed)
 "  Acute Office Visit  Subjective:     Patient ID: [name], female    DOB: Dec 19, 1975, 49 y.o.   MRN: [id number]  Chief Complaint  Patient presents with   Acute Visit    Pain in right side of head down into her neck x 1 week Pain 6-7/10     Discussed the use of AI scribe software for clinical note transcription with the patient, who gave verbal consent to proceed.  History of Present Illness [name] is a 49 year old female with iron  deficiency anemia who presents for follow-up of her anemia and neck pain.  Iron  deficiency anemia - Diagnosed with iron  deficiency anemia; previously evaluated in June for low blood counts - Currently taking iron  supplementation once daily due to nausea, instead of prescribed twice daily dosing - Missed doses of iron  supplementation - History of heavier menstrual periods over the past year, currently menstruating - Last colonoscopy was eight years ago - Concerned that current menstruation may impact blood test results  Neck pain - Neck pain for approximately one week - Pain localized to the right side of the neck, with occasional radiation to the head - Pain exacerbated by certain movements and positions, including lying down and turning the head - History of chronic back issues since childbirth - Motrin taken for the past two days with improvement in pain  Menstrual abnormalities - Heavier menstrual periods over the past year - Currently menstruating  Thyroid  disorder - Daily thyroid  medication use, sometimes taken with tea, which may affect absorption  Nutritional supplementation - Was previously on vitamin B12 vitamin D  supplements but is currently not taking any supplements  Constitutional symptoms - History of weight loss in the past, but weight stable for the past year (119-122 pounds)  Upper respiratory symptoms - No cough, cold, fever, chills, or odynophagia - Recent exposure to daughter with cough and runny nose, but no  symptoms herself    Review of Systems  Constitutional: Negative.   HENT: Negative.    Respiratory: Negative.    Cardiovascular: Negative.   Gastrointestinal: Negative.  Negative for blood in stool and melena.  Musculoskeletal:  Positive for myalgias and neck pain.  Neurological: Negative.   Psychiatric/Behavioral: Negative.          Objective:    BP 118/76   Pulse 72   Temp 98.1 F (36.7 C)   Ht 5' 5 (1.651 m)   Wt 117 lb 12.8 oz (53.4 kg)   SpO2 99%   BMI 19.60 kg/m    Physical Exam Constitutional:      Appearance: Normal appearance.  HENT:     Head: Normocephalic and atraumatic.     Mouth/Throat:     Mouth: Mucous membranes are moist.     Pharynx: Oropharynx is clear. No oropharyngeal exudate or posterior oropharyngeal erythema.  Cardiovascular:     Rate and Rhythm: Normal rate and regular rhythm.     Heart sounds: Normal heart sounds.  Pulmonary:     Effort: Pulmonary effort is normal.     Breath sounds: Normal breath sounds. No wheezing, rhonchi or rales.  Abdominal:     General: Bowel sounds are normal. There is no distension.     Palpations: Abdomen is soft.     Tenderness: There is no abdominal tenderness. There is no guarding or rebound.  Musculoskeletal:        General: No swelling or tenderness.     Right lower leg: No edema.  Left lower leg: No edema.  Neurological:     Mental Status: She is alert.  Psychiatric:        Mood and Affect: Mood normal.        Behavior: Behavior normal.     No results found for any visits on 10/05/24.      Assessment & Plan:   Problem List Items Addressed This Visit       Endocrine   Hypothyroidism (Chronic)   - This problem is chronic and stable -Last TSH was mildly elevated likely secondary to inconsistent Synthroid  use -Patient states that she has been compliant with her Synthroid  but occasionally does not wait 30 minutes before drinking tea or sometimes remembers to take it after she drinks tea and  this may affect absorption -Will recheck TSH today -Patient is also due for lipid panel and CMP.  Will check those today as well -No further workup at this time      Relevant Orders   Comprehensive metabolic panel with GFR   Lipid panel   TSH     Musculoskeletal and Integument   Neck muscle strain, initial encounter   - Patient complains of pain over the back of her head that radiates into her neck and upper back for the last week -Pain occurs when patient moves her head from a certain direction or when she attempts to lie down to go to sleep in a certain position.  -Patient has used Motrin in the last couple of days with good relief of her pain - On exam, patient some mild tightness in the muscles of her right upper back and neck.  No tenderness to palpation, no rash -I suspect pain is likely secondary to muscle strain -Will treat the patient with Robaxin  to use as needed.  Patient cautioned on side effects including drowsiness -Continue with Motrin as needed as well as over-the-counter muscle rub like Tiger balm or Biofreeze -Patient instructed to follow-up with us  if her symptoms persist or worsen -No further workup at this time      Relevant Medications   methocarbamol  (ROBAXIN ) 500 MG tablet     Other   Abnormal weight loss   - Patient complained of increased weight loss at her last visit that was unintentional -Patient states that since her last visit her weight has remained relatively stable and she weighs between 120 to 122 pounds at home -Weight here today is 117.8 pounds which is approximately 4 pounds more than her last visit -Patient was recommended to follow-up with Dr. Kristie for her colonoscopy given that it has been 8 years since her last colonoscopy and she does have iron  deficiency anemia as well (although this is likely secondary to heavy menstruation) -No further workup at this time      Iron  deficiency anemia - Primary   - Patient has a history of iron   deficiency anemia noted at her last visit with hemoglobin in the nines -Patient was recommended iron  tablets and has been taking 1 tablet today but has been inconsistent with this medication (does admit to skipping days).  States that the iron  tablets did not make her nauseous and she tries to take 1 pill a day -Will recheck CBC and iron  panel today -Patient to follow-up with Dr. Kristie for repeat colonoscopy -Likely etiology for this patient's iron  deficiency anemia is heavier menstruation over the last year but since it has been 8 years since her last colonoscopy and she does have iron  deficiency anemia repeat colonoscopy  would be appropriate -Referral placed to Dr. Kristie for repeat colonoscopy -No further workup at this time      Relevant Orders   IBC + Ferritin   CBC with Differential/Platelet   Ambulatory referral to Gastroenterology   Vitamin B 12 deficiency   - This problem is chronic and stable -Last vitamin B12 level is within normal limits -Patient is not currently on any vitamin B12 supplementation -Will recheck vitamin B12 levels today -No further workup at this time      Relevant Orders   B12   Vitamin D  deficiency   - This problem is chronic and stable -Last vitamin D  level was low and she was prescribed vitamin D  supplementation -She has completed course of vitamin D  supplementation -Will recheck her vitamin D  levels today -No further workup at this time      Relevant Orders   Comprehensive metabolic panel with GFR   VITAMIN D  25 Hydroxy (Vit-D Deficiency, Fractures)    Meds ordered this encounter  Medications   methocarbamol  (ROBAXIN ) 500 MG tablet    Sig: Take 1 tablet (500 mg total) by mouth every 8 (eight) hours as needed for muscle spasms.    Dispense:  30 tablet    Refill:  0    No follow-ups on file.  Sharifah Champine, MD   "

## 2024-10-05 NOTE — Assessment & Plan Note (Addendum)
-   This problem is chronic and stable -Last TSH was mildly elevated likely secondary to inconsistent Synthroid  use -Patient states that she has been compliant with her Synthroid  but occasionally does not wait 30 minutes before drinking tea or sometimes remembers to take it after she drinks tea and this may affect absorption -Will recheck TSH today -Patient is also due for lipid panel and CMP.  Will check those today as well -No further workup at this time

## 2024-10-05 NOTE — Assessment & Plan Note (Signed)
-   This problem is chronic and stable -Last vitamin B12 level is within normal limits -Patient is not currently on any vitamin B12 supplementation -Will recheck vitamin B12 levels today -No further workup at this time

## 2024-10-05 NOTE — Assessment & Plan Note (Signed)
-   Patient complained of increased weight loss at her last visit that was unintentional -Patient states that since her last visit her weight has remained relatively stable and she weighs between 120 to 122 pounds at home -Weight here today is 117.8 pounds which is approximately 4 pounds more than her last visit -Patient was recommended to follow-up with Dr. Kristie for her colonoscopy given that it has been 8 years since her last colonoscopy and she does have iron  deficiency anemia as well (although this is likely secondary to heavy menstruation) -No further workup at this time

## 2024-10-05 NOTE — Assessment & Plan Note (Signed)
-   Patient has a history of iron  deficiency anemia noted at her last visit with hemoglobin in the nines -Patient was recommended iron  tablets and has been taking 1 tablet today but has been inconsistent with this medication (does admit to skipping days).  States that the iron  tablets did not make her nauseous and she tries to take 1 pill a day -Will recheck CBC and iron  panel today -Patient to follow-up with Dr. Kristie for repeat colonoscopy -Likely etiology for this patient's iron  deficiency anemia is heavier menstruation over the last year but since it has been 8 years since her last colonoscopy and she does have iron  deficiency anemia repeat colonoscopy would be appropriate -Referral placed to Dr. Kristie for repeat colonoscopy -No further workup at this time

## 2024-10-06 ENCOUNTER — Ambulatory Visit: Payer: Self-pay | Admitting: Internal Medicine

## 2024-10-06 DIAGNOSIS — E559 Vitamin D deficiency, unspecified: Secondary | ICD-10-CM

## 2024-10-06 MED ORDER — VITAMIN D3 1.25 MG (50000 UT) PO CAPS: 1.0000 | ORAL_CAPSULE | ORAL | 0 refills | Status: AC

## 2024-10-09 ENCOUNTER — Telehealth: Payer: Self-pay

## 2024-10-09 NOTE — Telephone Encounter (Signed)
 Copied from CRM [id number]. Topic: Medical Record Request - Provider/Facility Request >> Oct 09, 2024  1:07 PM Wyona P wrote: Reason for CRM: Leontine called from [facility]   received a referral and just missing most recent blood work. For pt.   Fax : [phone]

## 2025-01-20 ENCOUNTER — Encounter
